# Patient Record
Sex: Male | Born: 1975 | Race: White | Hispanic: No | Marital: Single | Smoking: Former smoker
Health system: Southern US, Community
[De-identification: ages and names within clinical notes are randomized; demographics above are authoritative.]

## PROBLEM LIST (undated history)

## (undated) DIAGNOSIS — Z9221 Personal history of antineoplastic chemotherapy: Secondary | ICD-10-CM

## (undated) DIAGNOSIS — Z974 Presence of external hearing-aid: Secondary | ICD-10-CM

## (undated) DIAGNOSIS — G4733 Obstructive sleep apnea (adult) (pediatric): Secondary | ICD-10-CM

## (undated) DIAGNOSIS — I10 Essential (primary) hypertension: Secondary | ICD-10-CM

## (undated) DIAGNOSIS — K219 Gastro-esophageal reflux disease without esophagitis: Secondary | ICD-10-CM

## (undated) DIAGNOSIS — K429 Umbilical hernia without obstruction or gangrene: Secondary | ICD-10-CM

## (undated) HISTORY — PX: INGUINAL HERNIA REPAIR: SUR1180

## (undated) HISTORY — PX: KNEE ARTHROSCOPY W/ ACL RECONSTRUCTION: SHX1858

## (undated) HISTORY — PX: TYMPANOSTOMY TUBE PLACEMENT: SHX32

---

## 1998-12-03 ENCOUNTER — Emergency Department (HOSPITAL_COMMUNITY): Admission: EM | Admit: 1998-12-03 | Discharge: 1998-12-03 | Payer: Self-pay | Admitting: *Deleted

## 1998-12-03 ENCOUNTER — Encounter: Payer: Self-pay | Admitting: Emergency Medicine

## 1998-12-08 ENCOUNTER — Emergency Department (HOSPITAL_COMMUNITY): Admission: EM | Admit: 1998-12-08 | Discharge: 1998-12-08 | Payer: Self-pay | Admitting: Emergency Medicine

## 1999-09-04 ENCOUNTER — Emergency Department (HOSPITAL_COMMUNITY): Admission: EM | Admit: 1999-09-04 | Discharge: 1999-09-04 | Payer: Self-pay | Admitting: Emergency Medicine

## 1999-09-04 ENCOUNTER — Encounter: Payer: Self-pay | Admitting: Emergency Medicine

## 2003-12-03 ENCOUNTER — Encounter: Admission: RE | Admit: 2003-12-03 | Discharge: 2003-12-03 | Payer: Self-pay | Admitting: Family Medicine

## 2004-06-28 ENCOUNTER — Ambulatory Visit (HOSPITAL_COMMUNITY): Admission: RE | Admit: 2004-06-28 | Discharge: 2004-06-28 | Payer: Self-pay | Admitting: Family Medicine

## 2005-01-01 ENCOUNTER — Encounter: Admission: RE | Admit: 2005-01-01 | Discharge: 2005-01-01 | Payer: Self-pay | Admitting: Family Medicine

## 2008-05-08 DIAGNOSIS — D333 Benign neoplasm of cranial nerves: Secondary | ICD-10-CM

## 2008-05-08 HISTORY — DX: Benign neoplasm of cranial nerves: D33.3

## 2016-05-08 DIAGNOSIS — Q8502 Neurofibromatosis, type 2: Secondary | ICD-10-CM

## 2016-05-08 HISTORY — DX: Neurofibromatosis, type 2: Q85.02

## 2016-11-29 ENCOUNTER — Other Ambulatory Visit: Payer: Self-pay | Admitting: Family Medicine

## 2016-11-29 DIAGNOSIS — D333 Benign neoplasm of cranial nerves: Secondary | ICD-10-CM

## 2016-12-04 ENCOUNTER — Other Ambulatory Visit: Payer: Self-pay | Admitting: Family Medicine

## 2016-12-04 DIAGNOSIS — H93A2 Pulsatile tinnitus, left ear: Secondary | ICD-10-CM

## 2016-12-04 DIAGNOSIS — D333 Benign neoplasm of cranial nerves: Secondary | ICD-10-CM

## 2016-12-13 ENCOUNTER — Other Ambulatory Visit: Payer: Self-pay | Admitting: Family Medicine

## 2016-12-13 DIAGNOSIS — T1590XA Foreign body on external eye, part unspecified, unspecified eye, initial encounter: Secondary | ICD-10-CM

## 2016-12-27 ENCOUNTER — Ambulatory Visit
Admission: RE | Admit: 2016-12-27 | Discharge: 2016-12-27 | Disposition: A | Discharge status: Home / Self Care | Payer: Self-pay | Source: Ambulatory Visit | Attending: Family Medicine | Admitting: Family Medicine

## 2016-12-27 ENCOUNTER — Inpatient Hospital Stay: Admission: RE | Admit: 2016-12-27 | Payer: Self-pay | Source: Ambulatory Visit

## 2016-12-27 ENCOUNTER — Other Ambulatory Visit: Payer: Self-pay

## 2016-12-27 ENCOUNTER — Ambulatory Visit
Admission: RE | Admit: 2016-12-27 | Discharge: 2016-12-27 | Disposition: A | Discharge status: Home / Self Care | Payer: 59 | Source: Ambulatory Visit | Attending: Family Medicine | Admitting: Family Medicine

## 2016-12-27 DIAGNOSIS — D333 Benign neoplasm of cranial nerves: Secondary | ICD-10-CM

## 2016-12-27 DIAGNOSIS — H93A2 Pulsatile tinnitus, left ear: Secondary | ICD-10-CM

## 2016-12-27 MED ORDER — GADOBENATE DIMEGLUMINE 529 MG/ML IV SOLN
20.0000 mL | Freq: Once | INTRAVENOUS | Status: DC | PRN
Start: 2016-12-27 — End: 2016-12-28

## 2017-09-13 ENCOUNTER — Other Ambulatory Visit: Payer: Self-pay | Admitting: Family Medicine

## 2017-09-13 ENCOUNTER — Ambulatory Visit
Admission: RE | Admit: 2017-09-13 | Discharge: 2017-09-13 | Disposition: A | Discharge status: Home / Self Care | Payer: 59 | Source: Ambulatory Visit | Attending: Family Medicine | Admitting: Family Medicine

## 2017-09-13 DIAGNOSIS — M25561 Pain in right knee: Secondary | ICD-10-CM

## 2019-04-02 ENCOUNTER — Other Ambulatory Visit: Payer: Self-pay

## 2019-04-02 DIAGNOSIS — Z8616 Personal history of COVID-19: Secondary | ICD-10-CM

## 2019-04-02 DIAGNOSIS — Z20822 Contact with and (suspected) exposure to covid-19: Secondary | ICD-10-CM

## 2019-04-02 HISTORY — DX: Personal history of COVID-19: Z86.16

## 2019-04-04 LAB — NOVEL CORONAVIRUS, NAA: SARS-CoV-2, NAA: DETECTED — AB

## 2019-09-07 IMAGING — DX DG KNEE COMPLETE 4+V*R*
5 series · 5 of 5 positions shown · non-contrast
Comparison: None.

CLINICAL DATA: Right knee pain for 2-3 months.

EXAM:
RIGHT KNEE - COMPLETE 4+ VIEW

[dg knee complete 4 views right (1 of 5)]
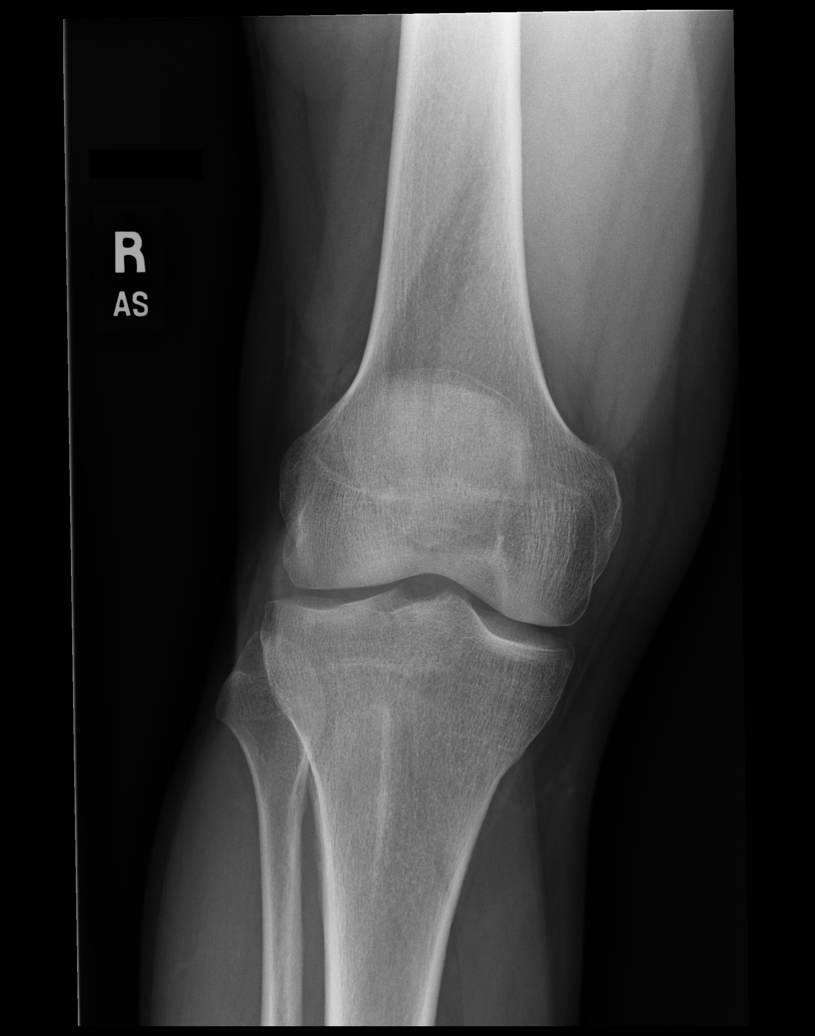

[dg knee complete 4 views right (2 of 5)]
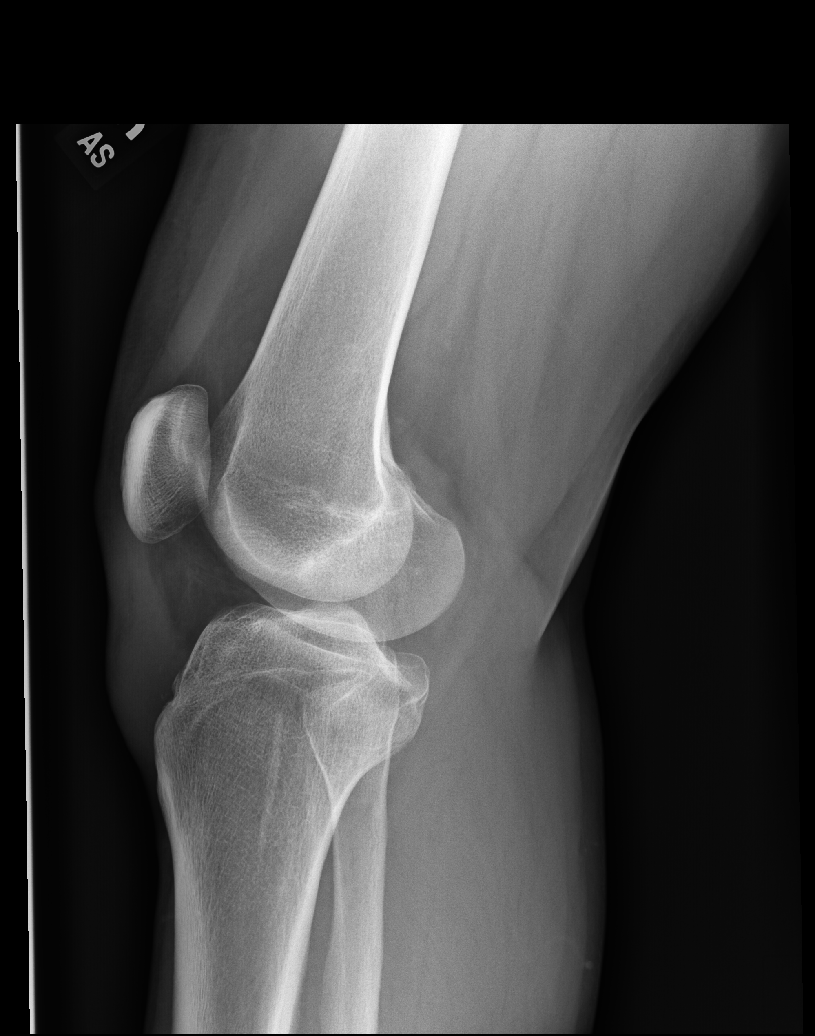

[dg knee complete 4 views right (3 of 5)]
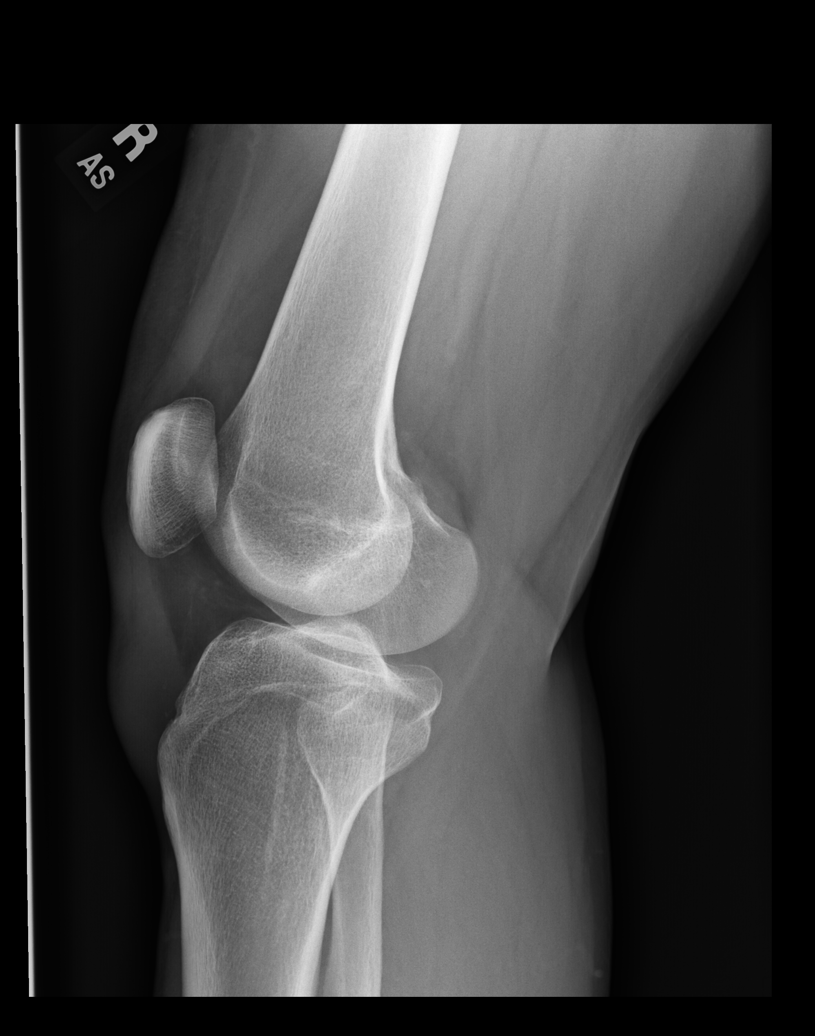

[dg knee complete 4 views right (4 of 5)]
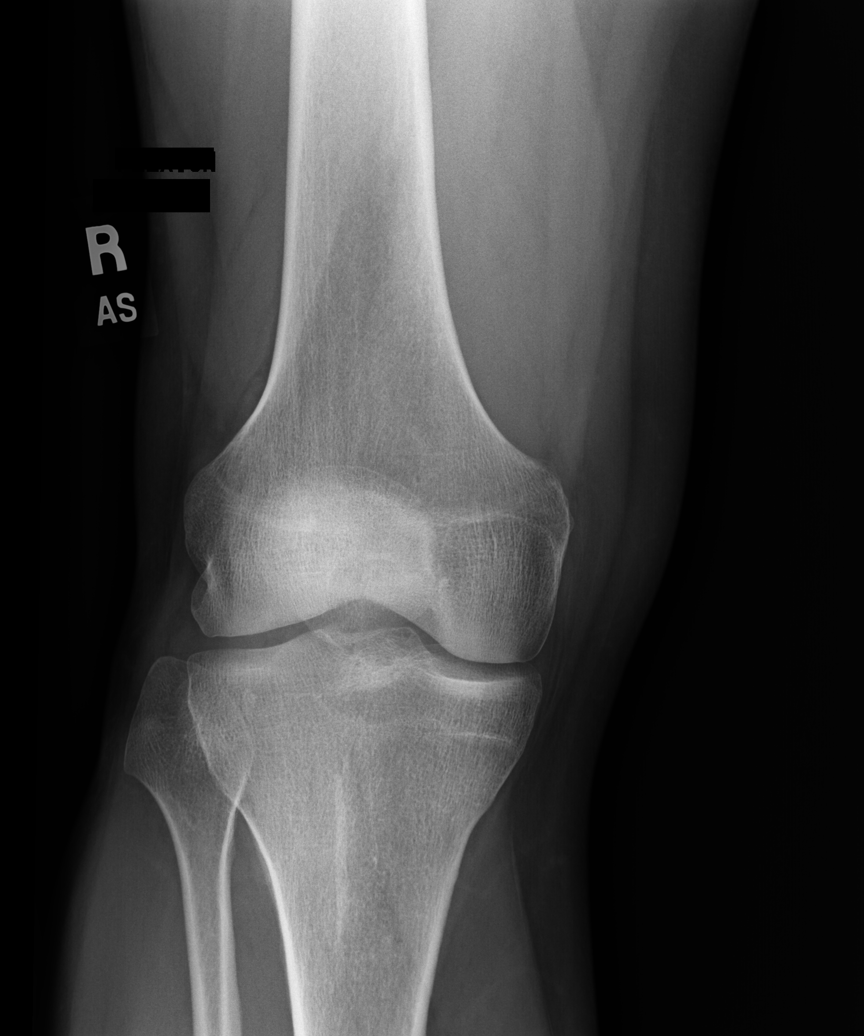

[dg knee complete 4 views right (5 of 5)]
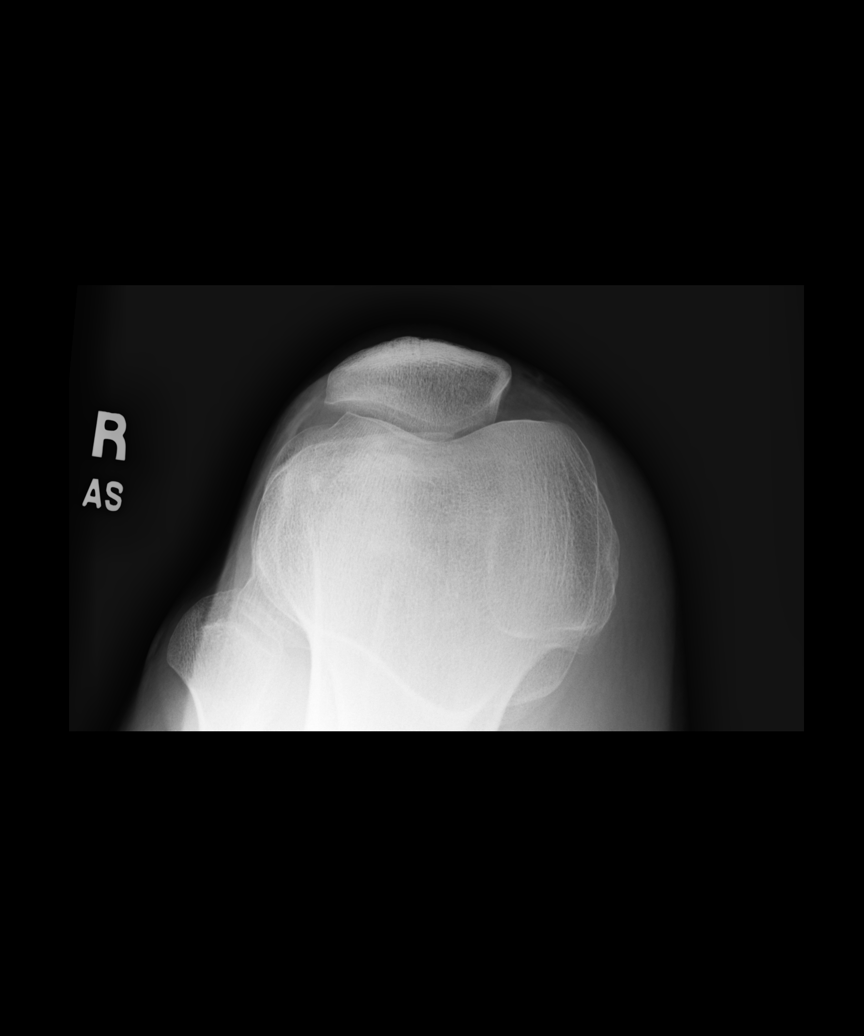

[5 of 5 positions shown; findings below may reference images not displayed]

FINDINGS: No evidence of fracture, dislocation, or joint effusion. No evidence
of arthropathy or other focal bone abnormality. Soft tissues are
unremarkable.
IMPRESSION: Negative.

## 2019-11-06 DIAGNOSIS — Z923 Personal history of irradiation: Secondary | ICD-10-CM

## 2019-11-06 HISTORY — DX: Personal history of irradiation: Z92.3

## 2020-02-11 DIAGNOSIS — Z01812 Encounter for preprocedural laboratory examination: Secondary | ICD-10-CM | POA: Diagnosis not present

## 2020-05-18 DIAGNOSIS — K219 Gastro-esophageal reflux disease without esophagitis: Secondary | ICD-10-CM | POA: Diagnosis not present

## 2020-05-18 DIAGNOSIS — K31A0 Gastric intestinal metaplasia, unspecified: Secondary | ICD-10-CM | POA: Diagnosis not present

## 2020-05-27 DIAGNOSIS — Q8502 Neurofibromatosis, type 2: Secondary | ICD-10-CM | POA: Diagnosis not present

## 2020-05-28 DIAGNOSIS — Q8502 Neurofibromatosis, type 2: Secondary | ICD-10-CM | POA: Diagnosis not present

## 2020-05-28 DIAGNOSIS — H9193 Unspecified hearing loss, bilateral: Secondary | ICD-10-CM | POA: Diagnosis not present

## 2020-06-01 ENCOUNTER — Ambulatory Visit: Payer: Self-pay | Admitting: General Surgery

## 2020-06-21 ENCOUNTER — Other Ambulatory Visit: Payer: Self-pay

## 2020-06-21 ENCOUNTER — Other Ambulatory Visit (HOSPITAL_COMMUNITY)
Admission: RE | Admit: 2020-06-21 | Discharge: 2020-06-21 | Disposition: A | Discharge status: Home / Self Care | Payer: BC Managed Care – PPO | Source: Ambulatory Visit | Attending: General Surgery | Admitting: General Surgery

## 2020-06-21 ENCOUNTER — Encounter (HOSPITAL_BASED_OUTPATIENT_CLINIC_OR_DEPARTMENT_OTHER): Payer: Self-pay | Admitting: General Surgery

## 2020-06-21 DIAGNOSIS — Z01812 Encounter for preprocedural laboratory examination: Secondary | ICD-10-CM | POA: Insufficient documentation

## 2020-06-21 DIAGNOSIS — Z8616 Personal history of COVID-19: Secondary | ICD-10-CM | POA: Diagnosis not present

## 2020-06-21 DIAGNOSIS — K429 Umbilical hernia without obstruction or gangrene: Secondary | ICD-10-CM | POA: Diagnosis not present

## 2020-06-21 DIAGNOSIS — Z9221 Personal history of antineoplastic chemotherapy: Secondary | ICD-10-CM | POA: Diagnosis not present

## 2020-06-21 DIAGNOSIS — Z20822 Contact with and (suspected) exposure to covid-19: Secondary | ICD-10-CM | POA: Insufficient documentation

## 2020-06-21 DIAGNOSIS — Q8502 Neurofibromatosis, type 2: Secondary | ICD-10-CM | POA: Diagnosis not present

## 2020-06-21 DIAGNOSIS — Z86018 Personal history of other benign neoplasm: Secondary | ICD-10-CM | POA: Diagnosis not present

## 2020-06-21 DIAGNOSIS — Z87891 Personal history of nicotine dependence: Secondary | ICD-10-CM | POA: Diagnosis not present

## 2020-06-21 DIAGNOSIS — Z79899 Other long term (current) drug therapy: Secondary | ICD-10-CM | POA: Diagnosis not present

## 2020-06-21 LAB — SARS CORONAVIRUS 2 (TAT 6-24 HRS): SARS Coronavirus 2: NEGATIVE

## 2020-06-21 NOTE — Progress Notes (Signed)
Spoke w/ via phone for pre-op interview--- PT Lab needs dos----  Istat and EKG             Lab results------ no COVID test ------ 06-21-2020 @ 1510 Arrive at ------- 0830 on 06-24-2020 NPO after MN NO Solid Food.  Clear liquids from MN until--- 0730 Medications to take morning of surgery ----- Norvasc, Prilosec Diabetic medication ----- n/a Patient Special Instructions ----- n/a Pre-Op special Istructions ----- n/a Patient verbalized understanding of instructions that were given at this phone interview. Patient denies shortness of breath, chest pain, fever, cough at this phone interview.  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

## 2020-06-24 ENCOUNTER — Encounter (HOSPITAL_BASED_OUTPATIENT_CLINIC_OR_DEPARTMENT_OTHER): Payer: Self-pay | Admitting: General Surgery

## 2020-06-24 ENCOUNTER — Ambulatory Visit (HOSPITAL_BASED_OUTPATIENT_CLINIC_OR_DEPARTMENT_OTHER): Payer: BC Managed Care – PPO | Admitting: Anesthesiology

## 2020-06-24 ENCOUNTER — Encounter (HOSPITAL_BASED_OUTPATIENT_CLINIC_OR_DEPARTMENT_OTHER)
Admission: RE | Disposition: A | Discharge status: Home / Self Care | Payer: Self-pay | Source: Home / Self Care | Attending: General Surgery

## 2020-06-24 ENCOUNTER — Ambulatory Visit (HOSPITAL_BASED_OUTPATIENT_CLINIC_OR_DEPARTMENT_OTHER)
Admission: RE | Admit: 2020-06-24 | Discharge: 2020-06-24 | Disposition: A | Discharge status: Home / Self Care | Payer: BC Managed Care – PPO | Attending: General Surgery | Admitting: General Surgery

## 2020-06-24 ENCOUNTER — Other Ambulatory Visit: Payer: Self-pay

## 2020-06-24 DIAGNOSIS — Z86018 Personal history of other benign neoplasm: Secondary | ICD-10-CM | POA: Insufficient documentation

## 2020-06-24 DIAGNOSIS — Z87891 Personal history of nicotine dependence: Secondary | ICD-10-CM | POA: Insufficient documentation

## 2020-06-24 DIAGNOSIS — Q8502 Neurofibromatosis, type 2: Secondary | ICD-10-CM | POA: Diagnosis not present

## 2020-06-24 DIAGNOSIS — K219 Gastro-esophageal reflux disease without esophagitis: Secondary | ICD-10-CM | POA: Diagnosis not present

## 2020-06-24 DIAGNOSIS — Z9221 Personal history of antineoplastic chemotherapy: Secondary | ICD-10-CM | POA: Insufficient documentation

## 2020-06-24 DIAGNOSIS — Z20822 Contact with and (suspected) exposure to covid-19: Secondary | ICD-10-CM | POA: Diagnosis not present

## 2020-06-24 DIAGNOSIS — G4730 Sleep apnea, unspecified: Secondary | ICD-10-CM | POA: Diagnosis not present

## 2020-06-24 DIAGNOSIS — Z79899 Other long term (current) drug therapy: Secondary | ICD-10-CM | POA: Insufficient documentation

## 2020-06-24 DIAGNOSIS — Z8616 Personal history of COVID-19: Secondary | ICD-10-CM | POA: Insufficient documentation

## 2020-06-24 DIAGNOSIS — K429 Umbilical hernia without obstruction or gangrene: Secondary | ICD-10-CM | POA: Diagnosis not present

## 2020-06-24 DIAGNOSIS — I10 Essential (primary) hypertension: Secondary | ICD-10-CM | POA: Diagnosis not present

## 2020-06-24 HISTORY — PX: UMBILICAL HERNIA REPAIR: SHX196

## 2020-06-24 HISTORY — DX: Gastro-esophageal reflux disease without esophagitis: K21.9

## 2020-06-24 HISTORY — DX: Essential (primary) hypertension: I10

## 2020-06-24 HISTORY — DX: Obstructive sleep apnea (adult) (pediatric): G47.33

## 2020-06-24 HISTORY — DX: Umbilical hernia without obstruction or gangrene: K42.9

## 2020-06-24 HISTORY — DX: Personal history of antineoplastic chemotherapy: Z92.21

## 2020-06-24 HISTORY — DX: Presence of external hearing-aid: Z97.4

## 2020-06-24 LAB — POCT I-STAT, CHEM 8
BUN: 13 mg/dL (ref 6–20)
Calcium, Ion: 1.3 mmol/L (ref 1.15–1.40)
Chloride: 105 mmol/L (ref 98–111)
Creatinine, Ser: 1 mg/dL (ref 0.61–1.24)
Glucose, Bld: 137 mg/dL — ABNORMAL HIGH (ref 70–99)
HCT: 45 % (ref 39.0–52.0)
Hemoglobin: 15.3 g/dL (ref 13.0–17.0)
Potassium: 3.7 mmol/L (ref 3.5–5.1)
Sodium: 143 mmol/L (ref 135–145)
TCO2: 25 mmol/L (ref 22–32)

## 2020-06-24 LAB — RESP PANEL BY RT-PCR (FLU A&B, COVID) ARPGX2
Influenza A by PCR: NEGATIVE
Influenza B by PCR: NEGATIVE
SARS Coronavirus 2 by RT PCR: NEGATIVE

## 2020-06-24 SURGERY — REPAIR, HERNIA, UMBILICAL, ADULT
Anesthesia: General

## 2020-06-24 MED ORDER — MIDAZOLAM HCL 5 MG/5ML IJ SOLN
INTRAMUSCULAR | Status: DC | PRN
  Administered 2020-06-24: 2 mg via INTRAVENOUS

## 2020-06-24 MED ORDER — PROPOFOL 10 MG/ML IV BOLUS
INTRAVENOUS | Status: AC
  Filled 2020-06-24: qty 40

## 2020-06-24 MED ORDER — KETOROLAC TROMETHAMINE 30 MG/ML IJ SOLN
INTRAMUSCULAR | Status: AC
  Filled 2020-06-24: qty 1

## 2020-06-24 MED ORDER — ROCURONIUM BROMIDE 100 MG/10ML IV SOLN
INTRAVENOUS | Status: DC | PRN
  Administered 2020-06-24 (×2): 10 mg via INTRAVENOUS
  Administered 2020-06-24: 60 mg via INTRAVENOUS
  Administered 2020-06-24: 10 mg via INTRAVENOUS

## 2020-06-24 MED ORDER — DEXMEDETOMIDINE (PRECEDEX) IN NS 20 MCG/5ML (4 MCG/ML) IV SYRINGE
PREFILLED_SYRINGE | INTRAVENOUS | Status: DC | PRN
  Administered 2020-06-24 (×2): 4 ug via INTRAVENOUS

## 2020-06-24 MED ORDER — HYDROMORPHONE HCL 1 MG/ML IJ SOLN
0.2500 mg | INTRAMUSCULAR | Status: DC | PRN
  Administered 2020-06-24 (×2): 0.25 mg via INTRAVENOUS

## 2020-06-24 MED ORDER — SUGAMMADEX SODIUM 200 MG/2ML IV SOLN
INTRAVENOUS | Status: DC | PRN
  Administered 2020-06-24: 250 mg via INTRAVENOUS

## 2020-06-24 MED ORDER — MIDAZOLAM HCL 2 MG/2ML IJ SOLN
INTRAMUSCULAR | Status: AC
  Filled 2020-06-24: qty 2

## 2020-06-24 MED ORDER — GABAPENTIN 300 MG PO CAPS
300.0000 mg | ORAL_CAPSULE | ORAL | Status: AC
  Administered 2020-06-24: 300 mg via ORAL

## 2020-06-24 MED ORDER — DEXMEDETOMIDINE (PRECEDEX) IN NS 20 MCG/5ML (4 MCG/ML) IV SYRINGE
PREFILLED_SYRINGE | INTRAVENOUS | Status: AC
  Filled 2020-06-24: qty 5

## 2020-06-24 MED ORDER — OXYCODONE HCL 5 MG/5ML PO SOLN
5.0000 mg | Freq: Once | ORAL | Status: DC | PRN
Start: 2020-06-24 — End: 2020-06-24

## 2020-06-24 MED ORDER — ACETAMINOPHEN 500 MG PO TABS
1000.0000 mg | ORAL_TABLET | ORAL | Status: AC
  Administered 2020-06-24: 1000 mg via ORAL

## 2020-06-24 MED ORDER — CHLORHEXIDINE GLUCONATE CLOTH 2 % EX PADS: 6.0000 | MEDICATED_PAD | Freq: Once | CUTANEOUS | Status: DC

## 2020-06-24 MED ORDER — IBUPROFEN 800 MG PO TABS: 800.0000 mg | ORAL_TABLET | Freq: Three times a day (TID) | ORAL | 0 refills | Status: AC | PRN

## 2020-06-24 MED ORDER — ROCURONIUM BROMIDE 10 MG/ML (PF) SYRINGE
PREFILLED_SYRINGE | INTRAVENOUS | Status: AC
  Filled 2020-06-24: qty 10

## 2020-06-24 MED ORDER — CELECOXIB 200 MG PO CAPS
ORAL_CAPSULE | ORAL | Status: AC
  Filled 2020-06-24: qty 2

## 2020-06-24 MED ORDER — HYDROMORPHONE HCL 1 MG/ML IJ SOLN
INTRAMUSCULAR | Status: AC
  Filled 2020-06-24: qty 1

## 2020-06-24 MED ORDER — BUPIVACAINE LIPOSOME 1.3 % IJ SUSP
INTRAMUSCULAR | Status: DC | PRN
  Administered 2020-06-24: 20 mL

## 2020-06-24 MED ORDER — CELECOXIB 200 MG PO CAPS
400.0000 mg | ORAL_CAPSULE | ORAL | Status: AC
  Administered 2020-06-24: 400 mg via ORAL

## 2020-06-24 MED ORDER — ENSURE PRE-SURGERY PO LIQD: 296.0000 mL | Freq: Once | ORAL | Status: DC

## 2020-06-24 MED ORDER — OXYCODONE HCL 5 MG PO TABS: 5.0000 mg | ORAL_TABLET | Freq: Four times a day (QID) | ORAL | 0 refills | Status: DC | PRN

## 2020-06-24 MED ORDER — FENTANYL CITRATE (PF) 100 MCG/2ML IJ SOLN
INTRAMUSCULAR | Status: DC | PRN
  Administered 2020-06-24 (×2): 50 ug via INTRAVENOUS
  Administered 2020-06-24: 100 ug via INTRAVENOUS

## 2020-06-24 MED ORDER — MEPERIDINE HCL 25 MG/ML IJ SOLN: 6.2500 mg | INTRAMUSCULAR | Status: DC | PRN

## 2020-06-24 MED ORDER — DEXAMETHASONE SODIUM PHOSPHATE 10 MG/ML IJ SOLN
INTRAMUSCULAR | Status: DC | PRN
  Administered 2020-06-24: 10 mg via INTRAVENOUS

## 2020-06-24 MED ORDER — PROMETHAZINE HCL 25 MG/ML IJ SOLN: 6.2500 mg | INTRAMUSCULAR | Status: DC | PRN

## 2020-06-24 MED ORDER — CEFAZOLIN SODIUM-DEXTROSE 2-4 GM/100ML-% IV SOLN
INTRAVENOUS | Status: AC
  Filled 2020-06-24: qty 100

## 2020-06-24 MED ORDER — LACTATED RINGERS IV SOLN: INTRAVENOUS | Status: DC

## 2020-06-24 MED ORDER — GABAPENTIN 300 MG PO CAPS
ORAL_CAPSULE | ORAL | Status: AC
  Filled 2020-06-24: qty 1

## 2020-06-24 MED ORDER — ONDANSETRON HCL 4 MG/2ML IJ SOLN
INTRAMUSCULAR | Status: DC | PRN
  Administered 2020-06-24 (×2): 4 mg via INTRAVENOUS

## 2020-06-24 MED ORDER — FENTANYL CITRATE (PF) 250 MCG/5ML IJ SOLN
INTRAMUSCULAR | Status: AC
  Filled 2020-06-24: qty 5

## 2020-06-24 MED ORDER — ACETAMINOPHEN 500 MG PO TABS
ORAL_TABLET | ORAL | Status: AC
  Filled 2020-06-24: qty 2

## 2020-06-24 MED ORDER — OXYCODONE HCL 5 MG PO TABS: 5.0000 mg | ORAL_TABLET | Freq: Once | ORAL | Status: DC | PRN

## 2020-06-24 MED ORDER — KETOROLAC TROMETHAMINE 30 MG/ML IJ SOLN
30.0000 mg | Freq: Once | INTRAMUSCULAR | Status: AC | PRN
  Administered 2020-06-24: 30 mg via INTRAVENOUS

## 2020-06-24 MED ORDER — LIDOCAINE 2% (20 MG/ML) 5 ML SYRINGE
INTRAMUSCULAR | Status: DC | PRN
  Administered 2020-06-24: 60 mg via INTRAVENOUS

## 2020-06-24 MED ORDER — LIDOCAINE HCL (PF) 2 % IJ SOLN
INTRAMUSCULAR | Status: AC
  Filled 2020-06-24: qty 5

## 2020-06-24 MED ORDER — CEFAZOLIN SODIUM-DEXTROSE 2-4 GM/100ML-% IV SOLN
2.0000 g | INTRAVENOUS | Status: AC
  Administered 2020-06-24: 2 g via INTRAVENOUS

## 2020-06-24 MED ORDER — BUPIVACAINE HCL 0.25 % IJ SOLN
INTRAMUSCULAR | Status: DC | PRN
Start: 2020-06-24 — End: 2020-06-24
  Administered 2020-06-24: 30 mL

## 2020-06-24 MED ORDER — PROPOFOL 10 MG/ML IV BOLUS
INTRAVENOUS | Status: DC | PRN
  Administered 2020-06-24: 200 mg via INTRAVENOUS

## 2020-06-24 SURGICAL SUPPLY — 49 items
ADH SKN CLS APL DERMABOND .7 (GAUZE/BANDAGES/DRESSINGS) ×1
APL PRP STRL LF DISP 70% ISPRP (MISCELLANEOUS) ×1
BINDER ABDOMINAL 12 ML 46-62 (SOFTGOODS) ×2 IMPLANT
BLADE CLIPPER SENSICLIP SURGIC (BLADE) IMPLANT
BLADE HEX COATED 2.75 (ELECTRODE) IMPLANT
BLADE SURG 15 STRL LF DISP TIS (BLADE) ×1 IMPLANT
BLADE SURG 15 STRL SS (BLADE) ×2
CANISTER SUCT 3000ML PPV (MISCELLANEOUS) IMPLANT
CELLS DAT CNTRL 66122 CELL SVR (MISCELLANEOUS) IMPLANT
CHLORAPREP W/TINT 26 (MISCELLANEOUS) ×2 IMPLANT
COVER BACK TABLE 60X90IN (DRAPES) ×2 IMPLANT
COVER MAYO STAND STRL (DRAPES) ×2 IMPLANT
COVER WAND RF STERILE (DRAPES) ×2 IMPLANT
DECANTER SPIKE VIAL GLASS SM (MISCELLANEOUS) IMPLANT
DERMABOND ADVANCED (GAUZE/BANDAGES/DRESSINGS) ×1
DERMABOND ADVANCED .7 DNX12 (GAUZE/BANDAGES/DRESSINGS) ×1 IMPLANT
DRAPE LAPAROSCOPIC ABDOMINAL (DRAPES) ×2 IMPLANT
DRAPE UTILITY XL STRL (DRAPES) ×2 IMPLANT
ELECT REM PT RETURN 9FT ADLT (ELECTROSURGICAL) ×2
ELECTRODE REM PT RTRN 9FT ADLT (ELECTROSURGICAL) ×1 IMPLANT
GLOVE SURG POLYISO LF SZ7 (GLOVE) ×2 IMPLANT
GLOVE SURG UNDER POLY LF SZ7 (GLOVE) ×2 IMPLANT
GOWN STRL REUS W/ TWL LRG LVL3 (GOWN DISPOSABLE) ×1 IMPLANT
GOWN STRL REUS W/TWL LRG LVL3 (GOWN DISPOSABLE) ×6 IMPLANT
KIT TURNOVER CYSTO (KITS) ×2 IMPLANT
MESH ULTRAPRO 6X6 15CM15CM (Mesh General) ×2 IMPLANT
NEEDLE HYPO 25X1 1.5 SAFETY (NEEDLE) ×2 IMPLANT
NS IRRIG 500ML POUR BTL (IV SOLUTION) ×2 IMPLANT
PACK BASIN DAY SURGERY FS (CUSTOM PROCEDURE TRAY) ×2 IMPLANT
PENCIL SMOKE EVACUATOR (MISCELLANEOUS) ×2 IMPLANT
RTRCTR WOUND ALEXIS 18CM MED (MISCELLANEOUS)
RTRCTR WOUND ALEXIS 18CM SML (INSTRUMENTS)
SAVER CELL AAL HAEMONETICS (INSTRUMENTS) IMPLANT
SPONGE LAP 4X18 RFD (DISPOSABLE) ×2 IMPLANT
SUT MNCRL AB 4-0 PS2 18 (SUTURE) ×2 IMPLANT
SUT NOVA NAB GS-21 0 18 T12 DT (SUTURE) ×2 IMPLANT
SUT PDS AB 0 CT1 36 (SUTURE) IMPLANT
SUT PDS AB 2-0 CT2 27 (SUTURE) ×4 IMPLANT
SUT PROLENE 0 CT 1 CR/8 (SUTURE) ×2 IMPLANT
SUT VIC AB 0 SH 27 (SUTURE) IMPLANT
SUT VIC AB 2-0 SH 27 (SUTURE)
SUT VIC AB 2-0 SH 27XBRD (SUTURE) IMPLANT
SUT VIC AB 3-0 SH 27 (SUTURE) ×4
SUT VIC AB 3-0 SH 27X BRD (SUTURE) ×2 IMPLANT
SYR BULB IRRIG 60ML STRL (SYRINGE) ×2 IMPLANT
SYR CONTROL 10ML LL (SYRINGE) ×2 IMPLANT
TOWEL OR 17X26 10 PK STRL BLUE (TOWEL DISPOSABLE) ×4 IMPLANT
TUBE CONNECTING 12X1/4 (SUCTIONS) ×2 IMPLANT
YANKAUER SUCT BULB TIP NO VENT (SUCTIONS) ×2 IMPLANT

## 2020-06-24 NOTE — Anesthesia Preprocedure Evaluation (Addendum)
Anesthesia Evaluation  Patient identified by MRN, date of birth, ID band Patient awake    Reviewed: Allergy & Precautions, NPO status , Patient's Chart, lab work & pertinent test results  Airway Mallampati: II  TM Distance: >3 FB Neck ROM: Full    Dental no notable dental hx. (+) Teeth Intact, Dental Advisory Given   Pulmonary sleep apnea , former smoker,  Quit smoking 2013   Pulmonary exam normal breath sounds clear to auscultation       Cardiovascular hypertension, Pt. 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, headaches S/p chemo 2018, 2019 S/p surgery for vestibular schwannoma  negative psych ROS   GI/Hepatic GERD  Medicated and Controlled,(+)     substance abuse  alcohol use, Umbilical hernia   Endo/Other  Obesity BMI 35  Renal/GU negative Renal ROS  negative genitourinary   Musculoskeletal negative musculoskeletal ROS (+)   Abdominal   Peds  Hematology negative hematology ROS (+)   Anesthesia Other Findings B/L hearing aides  Reproductive/Obstetrics negative OB ROS                           Anesthesia Physical Anesthesia Plan  ASA: II  Anesthesia Plan: General   Post-op Pain Management:    Induction: Intravenous  PONV Risk Score and Plan: 2 and Ondansetron, Dexamethasone, Midazolam and Treatment may vary due to age or medical condition  Airway Management Planned: Oral ETT  Additional Equipment: None  Intra-op Plan:   Post-operative Plan: Extubation in OR  Informed Consent: I have reviewed the patients History and Physical, chart, labs and discussed the procedure including the risks, benefits and alternatives for the proposed anesthesia with the patient or authorized representative who has indicated his/her understanding and acceptance.     Dental  advisory given  Plan Discussed with: CRNA  Anesthesia Plan Comments:        Anesthesia Quick Evaluation

## 2020-06-24 NOTE — Anesthesia Procedure Notes (Signed)
Procedure Name: Intubation Date/Time: 06/24/2020 10:37 AM Performed by: Gwyndolyn Saxon, CRNA Pre-anesthesia Checklist: Patient identified, Emergency Drugs available, Suction available and Patient being monitored Patient Re-evaluated:Patient Re-evaluated prior to induction Oxygen Delivery Method: Circle system utilized Preoxygenation: Pre-oxygenation with 100% oxygen Induction Type: IV induction Ventilation: Mask ventilation without difficulty Laryngoscope Size: Miller and 3 Grade View: Grade III Tube type: Oral Tube size: 7.5 mm Number of attempts: 1 Airway Equipment and Method: Patient positioned with wedge pillow and Stylet Placement Confirmation: ETT inserted through vocal cords under direct vision,  positive ETCO2 and breath sounds checked- equal and bilateral Secured at: 22 cm Tube secured with: Tape Dental Injury: Teeth and Oropharynx as per pre-operative assessment

## 2020-06-24 NOTE — Discharge Instructions (Signed)
[facility] _______[facility]  UMBILICAL OR INGUINAL HERNIA REPAIR: POST OP INSTRUCTIONS  Always review your discharge instruction sheet given to you by the facility where your surgery was performed. IF YOU HAVE DISABILITY OR FAMILY LEAVE FORMS, YOU MUST BRING THEM TO THE OFFICE FOR PROCESSING.   DO NOT GIVE THEM TO YOUR DOCTOR.  1. A  prescription for pain medication may be given to you upon discharge.  Take your pain medication as prescribed, if needed.  If narcotic pain medicine is not needed, then you may take acetaminophen (Tylenol) or ibuprofen (Advil) as needed. 2. Take your usually prescribed medications unless otherwise directed. If you need a refill on your pain medication, please contact your pharmacy.  They will contact our office to request authorization. Prescriptions will not be filled after 5 pm or on week-ends. 3. You should follow a light diet the first 24 hours after arrival home, such as soup and crackers, etc.  Be sure to include lots of fluids daily.  Resume your normal diet the day after surgery. 4.Most patients will experience some swelling and bruising around the umbilicus or in the groin and scrotum.  Ice packs and reclining will help.  Swelling and bruising can take several days to resolve.  6. It is common to experience some constipation if taking pain medication after surgery.  Increasing fluid intake and taking a stool softener (such as Colace) will usually help or prevent this problem from occurring.  A mild laxative (Milk of Magnesia or Miralax) should be taken according to package directions if there are no bowel movements after 48 hours. 7. Unless discharge instructions indicate otherwise, you may remove your bandages 24-48 hours after surgery, and you may shower at that time. If your surgeon used skin glue on the incision, you may shower in 24 hours.  The glue will flake off over the next 2-3 weeks.  Any sutures or staples will be removed at the office  during your follow-up visit. 8. ACTIVITIES:  You may resume regular (light) daily activities beginning the next day--such as daily self-care, walking, climbing stairs--gradually increasing activities as tolerated.  You may have sexual intercourse when it is comfortable.  Refrain from any heavy lifting or straining until approved by your doctor.  a.You may drive when you are no longer taking prescription pain medication, you can comfortably wear a seatbelt, and you can safely maneuver your car and apply brakes. b.RETURN TO WORK:   _____________________________________________  9.You should see your doctor in the office for a follow-up appointment approximately 2-3 weeks after your surgery.  Make sure that you call for this appointment within a day or two after you arrive home to insure a convenient appointment time. 10.OTHER INSTRUCTIONS: _________________________    _____________________________________  WHEN TO CALL YOUR DOCTOR: 1. Fever over 101.0 2. Inability to urinate 3. Nausea and/or vomiting 4. Extreme swelling or bruising 5. Continued bleeding from incision. 6. Increased pain, redness, or drainage from the incision  The clinic staff is available to answer your questions during regular business hours.  Please don't hesitate to call and ask to speak to one of the nurses for clinical concerns.  If you have a medical emergency, go to the nearest emergency room or call 911.  A surgeon from [facility] is always on call at the hospital   [address] [place], [place], [place]  [address] ?  [address], [place], [place]   [address] [phone] ? [phone] ? FAX [phone] Web site: [email or web address]   Post Anesthesia Home Care Instructions  Activity: Get plenty of rest for the remainder of the day. A responsible individual must stay with you for 24 hours following the procedure.  For the next 24 hours, DO NOT: -Drive a car -Conservation officer, nature -Drink alcoholic beverages -Take any medication unless instructed by your physician -Make any legal decisions or sign important papers.  Meals: Start with liquid foods such as gelatin or soup. Progress to regular foods as tolerated. Avoid greasy, spicy, heavy foods. If nausea and/or vomiting occur, drink only clear liquids until the nausea and/or vomiting subsides. Call your physician if vomiting continues.  Special Instructions/Symptoms: Your throat may feel dry or sore from the anesthesia or the breathing tube placed in your throat during surgery. If this causes discomfort, gargle with warm salt water. The discomfort should disappear within 24 hours.  If you had a scopolamine patch placed behind your ear for the management of post- operative nausea and/or vomiting:  1. The medication in the patch is effective for 72 hours, after which it should be removed.  Wrap patch in a tissue and discard in the trash. Wash hands thoroughly with soap and water. 2. You may remove the patch earlier than 72 hours if you experience unpleasant side effects which may include dry mouth, dizziness or visual disturbances. 3. Avoid touching the patch. Wash your hands with soap and water after contact with the patch.     Information for Discharge Teaching: EXPAREL (bupivacaine liposome injectable suspension)   Your surgeon or anesthesiologist gave you EXPAREL(bupivacaine) to help control your pain after surgery.   EXPAREL is a local anesthetic that provides pain relief by numbing the tissue around the surgical site.  EXPAREL is designed to release pain medication over time and can control pain for up to 72 hours.  Depending on how you respond to EXPAREL, you may require less pain medication during your recovery.  Possible side effects:  Temporary loss of sensation or ability to move in the area where bupivacaine was injected.  Nausea, vomiting, constipation  Rarely, numbness and tingling in  your mouth or lips, lightheadedness, or anxiety may occur.  Call your doctor right away if you think you may be experiencing any of these sensations, or if you have other questions regarding possible side effects.  Follow all other discharge instructions given to you by your surgeon or nurse. Eat a healthy diet and drink plenty of water or other fluids.  If you return to the hospital for any reason within 96 hours following the administration of EXPAREL, it is important for health care providers to know that you have received this anesthetic. A teal colored band has been placed on your arm with the date, time and amount of EXPAREL you have received in order to alert and inform your health care providers. Please leave this armband in place for the full 96 hours following administration, and then you may remove the band. (Monday 06/28/20)

## 2020-06-24 NOTE — Op Note (Signed)
PATIENT:  Dan Jackson  45 y.o. male  PRE-OPERATIVE DIAGNOSIS:  UMBILICAL HERNIA  POST-OPERATIVE DIAGNOSIS:  UMBILICAL HERNIA  PROCEDURE:  Procedure(s): OPEN UMBILICAL HERNIA REPAIR WITH MESH Placement of mesh between the rectus muscle and posterior rectus sheath  SURGEON:  Surgeon(s): Jeanpierre Thebeau, Arta Bruce, MD  ASSISTANT: none   ANESTHESIA:   local and general  Indications for procedure: Dan Jackson is a 45 y.o. year old male with symptoms of abdominal pain and umbilical hernia.  Description of procedure: The patient was brought into the operative suite. Anesthesia was administered with General endotracheal anesthesia. WHO checklist was applied. The patient was then placed in supine. The area was prepped and draped in the usual sterile fashion.  Next the infraumbilical skin was anesthetized with Marcaine/Exparel mix. A semilunar infraumbilical incision was made. Cautery and blunt dissection was used to dissect down to the fascia. The hernia sac was dissected free from surrounding tissues in 360 degrees. The umbilical skin was dissected free of the hernia sac with cautery. The hernia sac contained omentum and was opened and contents reduced into the peritoneal space. The hernia defect was 4 cm in diameter.   The retrorectus space was entered and dissected in 360 degrees. One area of posterior sheath was sutured with 3-0 vicryl to cover the peritoneum. The hernia sac was closed with 3-0 vicryl in running fashion. A 15 x 15 cm ultrapro mesh was placed into the retrorectus muscle. The anterior rectus sheath was then closed with 2-0 PDS in running fashion incorporating the mesh in the superior and inferior sutures to hold the mesh in place.   The umbilical skin was sutured to the fascia with a 3-0 vicryl. The deep dermal space was closed with a 3-0 vicryl. Marcaine/Exparel mix was injected into the muscle layer and around the fascia. The skin was closed with a 4-0 monocryl subcuticular  suture. Dermabond was put in place for dressing. The patient awoke from anesthesia and was brought to pacu in stable condition. All counts were correct.  Findings: 4 cm umbilical hernia  Specimen: none  Blood loss: 20 ml  Local anesthesia: 50 ml Marcaine/Exparel mix   Mesh: 15 x 15 cm ultrapro in the retrorectus space  Complications: none  PLAN OF CARE: Discharge to home after PACU  PATIENT DISPOSITION:  PACU - hemodynamically stable.  Gurney Maxin, M.D. General, Bariatric, & Minimally Invasive Surgery [facility], [place]  06/24/2020 11:49 AM

## 2020-06-24 NOTE — Anesthesia Postprocedure Evaluation (Signed)
Anesthesia Post Note  Patient: Dan Jackson  Procedure(s) Performed: OPEN UMBILICAL HERNIA REPAIR WITH MESH (N/A )     Patient location during evaluation: PACU Anesthesia Type: General Level of consciousness: awake and alert, oriented and patient cooperative Pain management: pain level controlled Vital Signs Assessment: post-procedure vital signs reviewed and stable Respiratory status: spontaneous breathing, nonlabored ventilation and respiratory function stable Cardiovascular status: blood pressure returned to baseline and stable Postop Assessment: no apparent nausea or vomiting Anesthetic complications: no   No complications documented.  Last Vitals:  Vitals:   06/24/20 1245 06/24/20 1300  BP: (!) 139/93 (!) 132/91  Pulse: 75 77  Resp: 17 17  Temp:    SpO2: 94% 94%    Last Pain:  Vitals:   06/24/20 1300  TempSrc:   PainSc: [place]

## 2020-06-24 NOTE — Transfer of Care (Signed)
Immediate Anesthesia Transfer of Care Note  Patient: Dan Jackson  Procedure(s) Performed: OPEN UMBILICAL HERNIA REPAIR WITH MESH (N/A )  Patient Location: PACU  Anesthesia Type:General  Level of Consciousness: awake and patient cooperative  Airway & Oxygen Therapy: Patient Spontanous Breathing and Patient connected to nasal cannula oxygen  Post-op Assessment: Report given to RN and Post -op Vital signs reviewed and stable  Post vital signs: Reviewed and stable  Last Vitals:  Vitals Value Taken Time  BP 128/92 06/24/20 1200  Temp 37 C 06/24/20 1158  Pulse 80 06/24/20 1206  Resp 12 06/24/20 1206  SpO2 95 % 06/24/20 1206  Vitals shown include unvalidated device data.  Last Pain:  Vitals:   06/24/20 1158  TempSrc:   PainSc: 0-No pain      Patients Stated Pain Goal: 5 (57/47/34 0370)  Complications: No complications documented.

## 2020-06-24 NOTE — H&P (Signed)
Dan Jackson is an 45 y.o. male.   Chief Complaint: hernia HPI: 45 yo male with umbilical hernia that causing pain especially with activity. He denies nausea or vomiting.  Past Medical History:  Diagnosis Date  . GERD (gastroesophageal reflux disease)   . History of 2019 novel coronavirus disease (COVID-19) 04/02/2019   positive covid result in epic,  mild symptoms that resolved  . History of chemotherapy 01-26-2017  to  02-08-2018   neurofibromatos type 2  . Hypertension   . 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 strowd ([facility])  dx 2010 bilateral VS with no symptoms other than headaches, hearing issues started 2018, then dx NF2 and progressive right VS,  chemotherapy 01-26-2017 to 02-08-2018,  GK-SRS 07/ 2021 4 fractions  . OSA on CPAP   . Status post gamma knife treatment 11/2019   sterotactic radiosurgery in 4 fractions, right vestribular schwannoma  . Umbilical hernia   . 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 Wears hearing aid in both ears     Past Surgical History:  Procedure Laterality Date  . INGUINAL HERNIA REPAIR Left   . KNEE ARTHROSCOPY W/ ACL RECONSTRUCTION Left teen  . TYMPANOSTOMY TUBE PLACEMENT      History reviewed. No pertinent family history. Social History:  reports that he quit smoking about 9 years ago. His smoking use included cigarettes. He quit after 10.00 years of use. He quit smokeless tobacco use about 6 months ago.  His smokeless tobacco use included chew. He reports current alcohol use of about 12.0 standard drinks of alcohol per week. He reports that he does not use drugs.  Allergies: No Known Allergies  Medications Prior to Admission  Medication Sig Dispense Refill  . amLODipine (NORVASC) 5 MG tablet Take 5 mg by mouth daily.    . hydrochlorothiazide (HYDRODIURIL) 25 MG tablet Take 25 mg by mouth daily.    Marland Kitchen olmesartan (BENICAR) 40 MG tablet Take 40 mg by mouth daily.    Marland Kitchen omeprazole (PRILOSEC) 20  MG capsule Take 20 mg by mouth daily.      Results for orders placed or performed during the hospital encounter of 06/24/20 (from the past 48 hour(s))  I-STAT, chem 8     Status: Abnormal   Collection Time: 06/24/20  9:01 AM  Result Value Ref Range   Sodium 143 135 - 145 mmol/L   Potassium 3.7 3.5 - 5.1 mmol/L   Chloride 105 98 - 111 mmol/L   BUN 13 6 - 20 mg/dL   Creatinine, Ser 1.00 0.61 - 1.24 mg/dL   Glucose, Bld 137 (H) 70 - 99 mg/dL    Comment: Glucose reference range applies only to samples taken after fasting for at least 8 hours.   Calcium, Ion 1.30 1.15 - 1.40 mmol/L   TCO2 25 22 - 32 mmol/L   Hemoglobin 15.3 13.0 - 17.0 g/dL   HCT 45.0 39.0 - 52.0 %  Resp Panel by RT-PCR (Flu A&B, Covid) Nasopharyngeal Swab     Status: None   Collection Time: 06/24/20  9:11 AM   Specimen: Nasopharyngeal Swab; Nasopharyngeal(NP) swabs in vial transport medium  Result Value Ref Range   SARS Coronavirus 2 by RT PCR NEGATIVE NEGATIVE    Comment: (NOTE) SARS-CoV-2 target nucleic acids are NOT DETECTED.  The SARS-CoV-2 RNA is generally detectable in upper respiratory specimens during the acute phase of infection. The lowest concentration of SARS-CoV-2 viral copies this assay  can detect is 138 copies/mL. A negative result does not preclude SARS-Cov-2 infection and should not be used as the sole basis for treatment or other patient management decisions. A negative result may occur with  improper specimen collection/handling, submission of specimen other than nasopharyngeal swab, presence of viral mutation(s) within the areas targeted by this assay, and inadequate number of viral copies(<138 copies/mL). A negative result must be combined with clinical observations, patient history, and epidemiological information. The expected result is Negative.  Fact Sheet for Patients:  EntrepreneurPulse.com.au  Fact Sheet for Healthcare Providers:   IncredibleEmployment.be  This test is no t yet approved or cleared by the [place] FDA and  has been authorized for detection and/or diagnosis of SARS-CoV-2 by FDA under an Emergency Use Authorization (EUA). This EUA will remain  in effect (meaning this test can be used) for the duration of the COVID-19 declaration under Section 564(b)(1) of the Act, 21 U.S.C.section 360bbb-3(b)(1), unless the authorization is terminated  or revoked sooner.       Influenza A by PCR NEGATIVE NEGATIVE   Influenza B by PCR NEGATIVE NEGATIVE    Comment: (NOTE) The Xpert Xpress SARS-CoV-2/FLU/RSV plus assay is intended as an aid in the diagnosis of influenza from Nasopharyngeal swab specimens and should not be used as a sole basis for treatment. Nasal washings and aspirates are unacceptable for Xpert Xpress SARS-CoV-2/FLU/RSV testing.  Fact Sheet for Patients: EntrepreneurPulse.com.au  Fact Sheet for Healthcare Providers: IncredibleEmployment.be  This test is not yet approved or cleared by the [place] FDA and has been authorized for detection and/or diagnosis of SARS-CoV-2 by FDA under an Emergency Use Authorization (EUA). This EUA will remain in effect (meaning this test can be used) for the duration of the COVID-19 declaration under Section 564(b)(1) of the Act, 21 U.S.C. section 360bbb-3(b)(1), unless the authorization is terminated or revoked.  Performed at [facility], [place] [address], [place], [place] [address]    No results found.  Review of Systems  Constitutional: Negative for chills and fever.  HENT: Negative for hearing loss.   Respiratory: Negative for cough.   Cardiovascular: Negative for chest pain and palpitations.  Gastrointestinal: Negative for abdominal pain, nausea and vomiting.  Genitourinary: Negative for dysuria and urgency.  Musculoskeletal: Negative for myalgias and neck pain.   Skin: Negative for rash.  Neurological: Negative for dizziness and headaches.  Hematological: Does not bruise/bleed easily.  Psychiatric/Behavioral: Negative for suicidal ideas.    Blood pressure 139/87, pulse 73, temperature 98.1 F (36.7 C), temperature source Oral, resp. rate 18, height 6\' 2"  (1.88 m), weight 124 kg. Physical Exam Vitals and nursing note reviewed.  Constitutional:      Appearance: He is well-developed and well-nourished.  HENT:     Head: Normocephalic and atraumatic.  Eyes:     General: No scleral icterus.    Extraocular Movements: EOM normal.     Conjunctiva/sclera: Conjunctivae normal.  Cardiovascular:     Rate and Rhythm: Normal rate and regular rhythm.  Pulmonary:     Effort: Pulmonary effort is normal.     Breath sounds: Normal breath sounds. No wheezing or rales.  Chest:     Chest wall: No tenderness.  Abdominal:     General: There is no distension.     Palpations: Abdomen is soft.     Tenderness: There is no abdominal tenderness. There is no rebound.     Comments: Umbilical hernia  Musculoskeletal:        General: No  edema. Normal range of motion.     Cervical back: Normal range of motion and neck supple.  Skin:    General: Skin is warm and dry.  Neurological:     Mental Status: He is alert and oriented to person, place, and time.  Psychiatric:        Mood and Affect: Mood and affect normal.        Behavior: Behavior normal.      Assessment/Plan 45 yo male with umbilical hernia -open umbilical hernia with mesh -planned outpatient procedure  Mickeal Skinner, MD 06/24/2020, 10:05 AM

## 2020-06-25 ENCOUNTER — Encounter (HOSPITAL_BASED_OUTPATIENT_CLINIC_OR_DEPARTMENT_OTHER): Payer: Self-pay | Admitting: General Surgery

## 2020-06-28 ENCOUNTER — Encounter (HOSPITAL_BASED_OUTPATIENT_CLINIC_OR_DEPARTMENT_OTHER): Payer: Self-pay | Admitting: General Surgery

## 2020-07-27 DIAGNOSIS — I10 Essential (primary) hypertension: Secondary | ICD-10-CM | POA: Diagnosis not present

## 2020-07-27 DIAGNOSIS — Z125 Encounter for screening for malignant neoplasm of prostate: Secondary | ICD-10-CM | POA: Diagnosis not present

## 2020-07-27 DIAGNOSIS — E7800 Pure hypercholesterolemia, unspecified: Secondary | ICD-10-CM | POA: Diagnosis not present

## 2020-07-27 DIAGNOSIS — F1010 Alcohol abuse, uncomplicated: Secondary | ICD-10-CM | POA: Diagnosis not present

## 2020-07-27 DIAGNOSIS — Z23 Encounter for immunization: Secondary | ICD-10-CM | POA: Diagnosis not present

## 2020-07-27 DIAGNOSIS — R609 Edema, unspecified: Secondary | ICD-10-CM | POA: Diagnosis not present

## 2020-07-27 DIAGNOSIS — Z0000 Encounter for general adult medical examination without abnormal findings: Secondary | ICD-10-CM | POA: Diagnosis not present

## 2020-08-05 DIAGNOSIS — H40013 Open angle with borderline findings, low risk, bilateral: Secondary | ICD-10-CM | POA: Diagnosis not present

## 2020-09-09 DIAGNOSIS — H40013 Open angle with borderline findings, low risk, bilateral: Secondary | ICD-10-CM | POA: Diagnosis not present

## 2020-09-30 DIAGNOSIS — Q8502 Neurofibromatosis, type 2: Secondary | ICD-10-CM | POA: Diagnosis not present

## 2020-10-12 DIAGNOSIS — H903 Sensorineural hearing loss, bilateral: Secondary | ICD-10-CM | POA: Diagnosis not present

## 2020-12-15 ENCOUNTER — Ambulatory Visit
Admission: EM | Admit: 2020-12-15 | Discharge: 2020-12-15 | Disposition: A | Discharge status: Home / Self Care | Payer: BC Managed Care – PPO | Attending: Urgent Care | Admitting: Urgent Care

## 2020-12-15 ENCOUNTER — Other Ambulatory Visit: Payer: Self-pay

## 2020-12-15 DIAGNOSIS — S0501XA Injury of conjunctiva and corneal abrasion without foreign body, right eye, initial encounter: Secondary | ICD-10-CM

## 2020-12-15 MED ORDER — TOBRAMYCIN 0.3 % OP SOLN: 1.0000 [drp] | OPHTHALMIC | 0 refills | Status: DC

## 2020-12-15 NOTE — ED Triage Notes (Signed)
Pt c/o 8/10 burning sharp pain to right eye 2/2 tree limb injury. Eye appears swollen, red/irritated, with minimal clear drainage. Pt states vision is blurry out of affected eye and it appears to be challenging for him to keep the affected eye open.

## 2020-12-15 NOTE — ED Provider Notes (Signed)
[place]   MRN: [id number] DOB: February 23, 1976  Subjective:   Dan Jackson is a 45 y.o. male presenting for suffering a right eye injury today.  Patient states that he was accidentally struck to the right eye while he was fully open today.  He has since had worsening pain, difficulty with his vision, cannot open his eye, has had persistent watering.  Has not tried any medications for relief.  No current facility-administered medications for this encounter.  Current Outpatient Medications:    amLODipine (NORVASC) 5 MG tablet, Take 5 mg by mouth daily., Disp: , Rfl:    hydrochlorothiazide (HYDRODIURIL) 25 MG tablet, Take 25 mg by mouth daily., Disp: , Rfl:    ibuprofen (ADVIL) 800 MG tablet, Take 1 tablet (800 mg total) by mouth every 8 (eight) hours as needed., Disp: 30 tablet, Rfl: 0   olmesartan (BENICAR) 40 MG tablet, Take 40 mg by mouth daily., Disp: , Rfl:    omeprazole (PRILOSEC) 20 MG capsule, Take 20 mg by mouth daily., Disp: , Rfl:    oxyCODONE (OXY IR/ROXICODONE) 5 MG immediate release tablet, Take 1 tablet (5 mg total) by mouth every 6 (six) hours as needed for severe pain., Disp: 20 tablet, Rfl: 0   No Known Allergies  Past Medical History:  Diagnosis Date   GERD (gastroesophageal reflux disease)    History of 2019 novel coronavirus disease (COVID-19) 04/02/2019   positive covid result in epic,  mild symptoms that resolved   History of chemotherapy 01-26-2017  to  02-08-2018   neurofibromatos type 2   Hypertension    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 No pertinent family history.  Social History   Tobacco Use   Smoking status: Former    Years: 10.00    Types: Cigarettes    Quit date: 06/22/2011    Years since quitting: 9.4   Smokeless tobacco: Former    Types: Chew    Quit date: 12/20/2019  Vaping Use   Vaping Use: Never used  Substance Use Topics   Alcohol use: Yes    Alcohol/week: 12.0 standard drinks    Types: 12 Cans of beer per week    Comment: 12 pk beer per week   Drug use: Never    ROS   Objective:   Vitals: BP (!) 146/95 (BP Location: Left Arm)   Pulse 79   Temp 98.5 F (36.9 C) (Oral)   Resp 18   SpO2 95%   Physical Exam Constitutional:      General: He  is not in acute distress.    Appearance: Normal appearance. He is well-developed and normal weight. He is not ill-appearing, toxic-appearing or diaphoretic.  HENT:     Head: Normocephalic and atraumatic.     Right Ear: External ear normal.     Left Ear: External ear normal.     Nose: Nose normal.     Mouth/Throat:     Pharynx: Oropharynx is clear.  Eyes:     General: No scleral icterus.       Right eye: No discharge.        Left eye: No discharge.     Extraocular Movements: Extraocular movements intact.     Pupils: Pupils are equal, round, and reactive to light.   Cardiovascular:     Rate and Rhythm: Normal rate.  Pulmonary:     Effort: Pulmonary effort is normal.  Musculoskeletal:     Cervical back: Normal range of motion.  Neurological:     Mental Status: He is  alert and oriented to person, place, and time.  Psychiatric:        Mood and Affect: Mood normal.        Behavior: Behavior normal.        Thought Content: Thought content normal.        Judgment: Judgment normal.    Eye Exam: Eyelids everted and swept for foreign body. The eye was anesthetized with 2 drops of tetracaine and stained with fluorescein. Examination under woods lamp does reveal an area of increased stain uptake as outlined on the diagram. The eye was then irrigated copiously with saline.   Assessment and Plan :   PDMP not reviewed this encounter.  1. Abrasion of right cornea, initial encounter     Will have patient start tobramycin, use naproxen and or ibuprofen for pain relief.  Follow-up with ophthalmologist tomorrow as soon as possible.  Information given to the patient for 3 different practices. Counseled patient on potential for adverse effects with medications prescribed/recommended today, ER and return-to-clinic precautions discussed, patient verbalized understanding.    Jaynee Eagles, PA-C 12/15/20 1902

## 2020-12-16 DIAGNOSIS — H2000 Unspecified acute and subacute iridocyclitis: Secondary | ICD-10-CM | POA: Diagnosis not present

## 2020-12-16 DIAGNOSIS — S0501XA Injury of conjunctiva and corneal abrasion without foreign body, right eye, initial encounter: Secondary | ICD-10-CM | POA: Diagnosis not present

## 2020-12-17 DIAGNOSIS — S0501XD Injury of conjunctiva and corneal abrasion without foreign body, right eye, subsequent encounter: Secondary | ICD-10-CM | POA: Diagnosis not present

## 2020-12-17 DIAGNOSIS — H2000 Unspecified acute and subacute iridocyclitis: Secondary | ICD-10-CM | POA: Diagnosis not present

## 2020-12-24 DIAGNOSIS — S0501XD Injury of conjunctiva and corneal abrasion without foreign body, right eye, subsequent encounter: Secondary | ICD-10-CM | POA: Diagnosis not present

## 2020-12-24 DIAGNOSIS — H2000 Unspecified acute and subacute iridocyclitis: Secondary | ICD-10-CM | POA: Diagnosis not present

## 2020-12-24 DIAGNOSIS — H40051 Ocular hypertension, right eye: Secondary | ICD-10-CM | POA: Diagnosis not present

## 2021-01-05 DIAGNOSIS — G4733 Obstructive sleep apnea (adult) (pediatric): Secondary | ICD-10-CM | POA: Diagnosis not present

## 2021-01-11 DIAGNOSIS — G4733 Obstructive sleep apnea (adult) (pediatric): Secondary | ICD-10-CM | POA: Diagnosis not present

## 2021-01-11 DIAGNOSIS — G4700 Insomnia, unspecified: Secondary | ICD-10-CM | POA: Diagnosis not present

## 2021-01-31 DIAGNOSIS — R609 Edema, unspecified: Secondary | ICD-10-CM | POA: Diagnosis not present

## 2021-01-31 DIAGNOSIS — E7800 Pure hypercholesterolemia, unspecified: Secondary | ICD-10-CM | POA: Diagnosis not present

## 2021-01-31 DIAGNOSIS — F1010 Alcohol abuse, uncomplicated: Secondary | ICD-10-CM | POA: Diagnosis not present

## 2021-01-31 DIAGNOSIS — Z23 Encounter for immunization: Secondary | ICD-10-CM | POA: Diagnosis not present

## 2021-01-31 DIAGNOSIS — I10 Essential (primary) hypertension: Secondary | ICD-10-CM | POA: Diagnosis not present

## 2021-03-08 DIAGNOSIS — F4323 Adjustment disorder with mixed anxiety and depressed mood: Secondary | ICD-10-CM | POA: Diagnosis not present

## 2021-03-09 DIAGNOSIS — Q8502 Neurofibromatosis, type 2: Secondary | ICD-10-CM | POA: Diagnosis not present

## 2021-03-14 DIAGNOSIS — G4733 Obstructive sleep apnea (adult) (pediatric): Secondary | ICD-10-CM | POA: Diagnosis not present

## 2021-03-21 DIAGNOSIS — F4323 Adjustment disorder with mixed anxiety and depressed mood: Secondary | ICD-10-CM | POA: Diagnosis not present

## 2021-04-12 DIAGNOSIS — K5730 Diverticulosis of large intestine without perforation or abscess without bleeding: Secondary | ICD-10-CM | POA: Diagnosis not present

## 2021-04-12 DIAGNOSIS — Z1211 Encounter for screening for malignant neoplasm of colon: Secondary | ICD-10-CM | POA: Diagnosis not present

## 2021-04-12 DIAGNOSIS — D120 Benign neoplasm of cecum: Secondary | ICD-10-CM | POA: Diagnosis not present

## 2021-04-12 DIAGNOSIS — K621 Rectal polyp: Secondary | ICD-10-CM | POA: Diagnosis not present

## 2021-04-12 DIAGNOSIS — D122 Benign neoplasm of ascending colon: Secondary | ICD-10-CM | POA: Diagnosis not present

## 2021-04-12 DIAGNOSIS — K648 Other hemorrhoids: Secondary | ICD-10-CM | POA: Diagnosis not present

## 2021-04-13 DIAGNOSIS — G4733 Obstructive sleep apnea (adult) (pediatric): Secondary | ICD-10-CM | POA: Diagnosis not present

## 2021-04-13 DIAGNOSIS — F4323 Adjustment disorder with mixed anxiety and depressed mood: Secondary | ICD-10-CM | POA: Diagnosis not present

## 2021-04-18 DIAGNOSIS — G4733 Obstructive sleep apnea (adult) (pediatric): Secondary | ICD-10-CM | POA: Diagnosis not present

## 2021-05-04 DIAGNOSIS — G4733 Obstructive sleep apnea (adult) (pediatric): Secondary | ICD-10-CM | POA: Diagnosis not present

## 2021-12-26 ENCOUNTER — Ambulatory Visit: Payer: BC Managed Care – PPO | Admitting: Internal Medicine

## 2023-06-30 ENCOUNTER — Other Ambulatory Visit: Payer: Self-pay

## 2023-06-30 ENCOUNTER — Encounter (HOSPITAL_BASED_OUTPATIENT_CLINIC_OR_DEPARTMENT_OTHER): Payer: Self-pay | Admitting: *Deleted

## 2023-06-30 ENCOUNTER — Observation Stay (HOSPITAL_BASED_OUTPATIENT_CLINIC_OR_DEPARTMENT_OTHER)
Admission: EM | Admit: 2023-06-30 | Discharge: 2023-07-01 | Disposition: A | Discharge status: Home / Self Care | Payer: BLUE CROSS/BLUE SHIELD | Attending: General Surgery | Admitting: General Surgery

## 2023-06-30 ENCOUNTER — Emergency Department (HOSPITAL_BASED_OUTPATIENT_CLINIC_OR_DEPARTMENT_OTHER): Payer: BLUE CROSS/BLUE SHIELD

## 2023-06-30 ENCOUNTER — Other Ambulatory Visit: Payer: Self-pay | Admitting: General Surgery

## 2023-06-30 DIAGNOSIS — Z8616 Personal history of COVID-19: Secondary | ICD-10-CM | POA: Insufficient documentation

## 2023-06-30 DIAGNOSIS — K3580 Unspecified acute appendicitis: Principal | ICD-10-CM | POA: Insufficient documentation

## 2023-06-30 DIAGNOSIS — Z79899 Other long term (current) drug therapy: Secondary | ICD-10-CM | POA: Insufficient documentation

## 2023-06-30 DIAGNOSIS — Z87891 Personal history of nicotine dependence: Secondary | ICD-10-CM | POA: Diagnosis not present

## 2023-06-30 DIAGNOSIS — K3589 Other acute appendicitis without perforation or gangrene: Secondary | ICD-10-CM

## 2023-06-30 DIAGNOSIS — I10 Essential (primary) hypertension: Secondary | ICD-10-CM | POA: Diagnosis not present

## 2023-06-30 DIAGNOSIS — R1031 Right lower quadrant pain: Secondary | ICD-10-CM | POA: Diagnosis present

## 2023-06-30 LAB — LIPASE, BLOOD: Lipase: 39 U/L (ref 11–51)

## 2023-06-30 LAB — COMPREHENSIVE METABOLIC PANEL
ALT: 45 U/L — ABNORMAL HIGH (ref 0–44)
AST: 33 U/L (ref 15–41)
Albumin: 4.3 g/dL (ref 3.5–5.0)
Alkaline Phosphatase: 60 U/L (ref 38–126)
Anion gap: 11 (ref 5–15)
BUN: 15 mg/dL (ref 6–20)
CO2: 24 mmol/L (ref 22–32)
Calcium: 9.3 mg/dL (ref 8.9–10.3)
Chloride: 103 mmol/L (ref 98–111)
Creatinine, Ser: 1.09 mg/dL (ref 0.61–1.24)
GFR, Estimated: >60 mL/min (ref >60)
Glucose, Bld: 105 mg/dL — ABNORMAL HIGH (ref 70–99)
Potassium: 3.6 mmol/L (ref 3.5–5.1)
Sodium: 138 mmol/L (ref 135–145)
Total Bilirubin: 0.5 mg/dL (ref 0.0–1.2)
Total Protein: 7.3 g/dL (ref 6.5–8.1)

## 2023-06-30 LAB — CBC
HCT: 44.6 % (ref 39.0–52.0)
Hemoglobin: 15.1 g/dL (ref 13.0–17.0)
MCH: 29.7 pg (ref 26.0–34.0)
MCHC: 33.9 g/dL (ref 30.0–36.0)
MCV: 87.8 fL (ref 80.0–100.0)
Platelets: 277 10*3/uL (ref 150–400)
RBC: 5.08 MIL/uL (ref 4.22–5.81)
RDW: 12.7 % (ref 11.5–15.5)
WBC: 15.5 10*3/uL — ABNORMAL HIGH (ref 4.0–10.5)
nRBC: 0 % (ref 0.0–0.2)

## 2023-06-30 LAB — URINALYSIS, ROUTINE W REFLEX MICROSCOPIC
Bilirubin Urine: NEGATIVE
Glucose, UA: NEGATIVE mg/dL
Hgb urine dipstick: NEGATIVE
Ketones, ur: NEGATIVE mg/dL
Leukocytes,Ua: NEGATIVE
Nitrite: NEGATIVE
Protein, ur: NEGATIVE mg/dL
Specific Gravity, Urine: >=1.03 (ref 1.005–1.030)
pH: 5.5 (ref 5.0–8.0)

## 2023-06-30 MED ORDER — METRONIDAZOLE 500 MG/100ML IV SOLN
500.0000 mg | Freq: Once | INTRAVENOUS | Status: AC
  Administered 2023-06-30: 500 mg via INTRAVENOUS
  Filled 2023-06-30: qty 100

## 2023-06-30 MED ORDER — SODIUM CHLORIDE 0.9 % IV SOLN
2.0000 g | Freq: Once | INTRAVENOUS | Status: AC
  Administered 2023-06-30: 2 g via INTRAVENOUS
  Filled 2023-06-30: qty 20

## 2023-06-30 MED ORDER — IOHEXOL 300 MG/ML  SOLN
100.0000 mL | Freq: Once | INTRAMUSCULAR | Status: AC | PRN
  Administered 2023-06-30: 100 mL via INTRAVENOUS

## 2023-06-30 MED ORDER — FENTANYL CITRATE PF 50 MCG/ML IJ SOSY
50.0000 ug | PREFILLED_SYRINGE | Freq: Once | INTRAMUSCULAR | Status: AC
  Administered 2023-06-30: 50 ug via INTRAVENOUS
  Filled 2023-06-30: qty 1

## 2023-06-30 MED ORDER — ACETAMINOPHEN 500 MG PO TABS
1000.0000 mg | ORAL_TABLET | Freq: Once | ORAL | Status: AC
  Administered 2023-06-30: 1000 mg via ORAL
  Filled 2023-06-30: qty 2

## 2023-06-30 NOTE — ED Triage Notes (Signed)
 Pt is here for sharp and severe abdominal pain which began around 15:30 and has been associated with nausea.  Abdomen feels distended.  No urinary symptoms with

## 2023-06-30 NOTE — H&P (Signed)
 Dan Jackson is an 48 y.o. male.   Chief Complaint: abdominal pain HPI:  Pt presents with <24 hours of worsening abdominal pain, now worsened to the RLQ.  He had some mild nausea, but no vomiting.  He was able to eat.  He hadn't had pain like that before so he went to the ED.  He had increased pain with moving.  Nothing he did made the pain better.  He had no fever/chills.    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 No pertinent family history. Social History:  reports that he quit smoking about 12 years ago. His smoking use included cigarettes. He started smoking about 22 years ago. He quit smokeless tobacco use about 3 years ago.  His smokeless tobacco use included chew. He reports current alcohol use of about 12.0 standard  drinks of alcohol per week. He reports that he does not use drugs.  Allergies: No Known Allergies  Meds: Amlodipine Hydrochlorothiazide Ibuprofen Benicar prilosec   Results for orders placed or performed during the hospital encounter of 06/30/23 (from the past 48 hours)  Lipase, blood     Status: None   Collection Time: 06/30/23  5:41 PM  Result Value Ref Range   Lipase 39 11 - 51 U/L    Comment: Performed at [facility], [address] [place], [place] [address]  Comprehensive metabolic panel     Status: Abnormal   Collection Time: 06/30/23  5:41 PM  Result Value Ref Range   Sodium 138 135 - 145 mmol/L   Potassium 3.6 3.5 - 5.1 mmol/L   Chloride 103 98 - 111 mmol/L   CO2 24 22 - 32 mmol/L   Glucose, Bld 105 (H) 70 - 99 mg/dL    Comment: Glucose reference range applies only to samples taken after fasting for at least 8 hours.   BUN 15 6 - 20 mg/dL   Creatinine, Ser 6.04 0.61 - 1.24 mg/dL   Calcium 9.3 8.9 - 54.0 mg/dL   Total Protein 7.3 6.5 - 8.1 g/dL   Albumin 4.3 3.5 - 5.0 g/dL   AST 33 15 - 41 U/L   ALT 45 (H) 0 - 44 U/L   Alkaline  Phosphatase 60 38 - 126 U/L   Total Bilirubin 0.5 0.0 - 1.2 mg/dL   GFR, Estimated >40 >98 mL/min    Comment: (NOTE) Calculated using the CKD-EPI Creatinine Equation (2021)    Anion gap 11 5 - 15    Comment: Performed at [facility], [address] [place], [place] [address]  CBC     Status: Abnormal   Collection Time: 06/30/23  5:41 PM  Result Value Ref Range   WBC 15.5 (H) 4.0 - 10.5 K/uL   RBC 5.08 4.22 - 5.81 MIL/uL   Hemoglobin 15.1 13.0 - 17.0 g/dL   HCT 78.2 95.6 - 21.3 %   MCV 87.8 80.0 - 100.0 fL   MCH 29.7 26.0 - 34.0 pg   MCHC 33.9 30.0 - 36.0 g/dL   RDW 08.6 57.8 - 46.9 %   Platelets 277 150 - 400 K/uL   nRBC 0.0 0.0 - 0.2 %    Comment: Performed at [facility], [address] [place], [place] [address]  Urinalysis, Routine w reflex microscopic -Urine, Clean Catch     Status:  None   Collection Time: 06/30/23  5:42 PM  Result Value Ref Range   Color, Urine YELLOW YELLOW   APPearance CLEAR CLEAR   Specific Gravity, Urine >=1.030 1.005 - 1.030   pH 5.5 5.0 - 8.0   Glucose, UA NEGATIVE NEGATIVE mg/dL   Hgb urine dipstick NEGATIVE NEGATIVE   Bilirubin Urine NEGATIVE NEGATIVE   Ketones, ur NEGATIVE NEGATIVE mg/dL   Protein, ur NEGATIVE NEGATIVE mg/dL   Nitrite NEGATIVE NEGATIVE   Leukocytes,Ua NEGATIVE NEGATIVE    Comment: Microscopic not done on urines with negative protein, blood, leukocytes, nitrite, or glucose < 500 mg/dL. Performed at [facility], [address] [place], [place] [address]   MRSA Next Gen by PCR, Nasal     Status: None   Collection Time: 07/01/23  1:46 AM   Specimen: Nasal Mucosa; Nasal Swab  Result Value Ref Range   MRSA by PCR Next Gen NOT DETECTED NOT DETECTED    Comment: (NOTE) The GeneXpert MRSA Assay (FDA approved for NASAL specimens only), is one component of a comprehensive MRSA colonization surveillance program. It is not intended to diagnose MRSA infection nor to guide or monitor treatment for MRSA infections. Test performance is not FDA approved in patients less than 34 years old. Performed at [facility], [address], [place], [place] [address]    CT ABDOMEN PELVIS W CONTRAST Result Date: 06/30/2023 CLINICAL DATA:  Acute abdominal pain, nonlocalized. Starting today. Nausea. EXAM: CT ABDOMEN AND PELVIS WITH CONTRAST TECHNIQUE: Multidetector CT imaging of the abdomen and pelvis was performed using the standard protocol following bolus administration of intravenous contrast. RADIATION DOSE REDUCTION: This exam was performed according to the departmental dose-optimization program which includes automated exposure control, adjustment of the mA and/or kV according to patient size and/or use of iterative reconstruction technique. CONTRAST:  OMNIPAQUE IOHEXOL 300 MG/ML  SOLN COMPARISON:  CT  abdomen 12/03/2003 FINDINGS: Lower chest: No acute abnormality. Hepatobiliary: Smooth liver contours. There is again diffuse decreased density throughout the liver suggesting fatty infiltration. The gallbladder is unremarkable. No intrahepatic or extrahepatic biliary ductal dilatation. Pancreas: Unremarkable. No pancreatic ductal dilatation or surrounding inflammatory changes. Spleen: Normal in size without focal abnormality. Adrenals/Urinary Tract: Normal adrenals. The kidneys enhance uniformly and are symmetric in size without hydronephrosis. New 13 mm right upper pole parapelvic cyst compared  to remote 12/03/2003 CT. New left lower pole exophytic low-density lesion with internal density measuring 29-33 Hounsfield units, measuring up to 5.8 x 4.5 x 5.2 cm (transverse by AP by craniocaudal). This is favored to represent an exophytic mildly complex cyst. Consider nonemergent renal ultrasound to ensure this is a cyst and not a solid mass. The bilateral ureters are normal in caliber. No focal urinary bladder wall thickening is seen. Stomach/Bowel: Mild descending colon diverticulosis without inflammatory changes to indicate acute diverticulitis. The terminal ileum is unremarkable. There is new mild enlargement of the distal appendix (axial series 301 image 60 and coronal series 601, image 89) with increased peripheral wall enhancement and mild surrounding inflammatory stranding consistent with acute appendicitis. No dilated loops of small bowel are seen to indicate bowel obstruction. Vascular/Lymphatic: No significant vascular findings are present. No enlarged abdominal or pelvic lymph nodes. Reproductive: The prostate and seminal vesicles are grossly unremarkable. Other: No ventral abdominal wall hernia. No free air or free fluid is seen within the abdomen or pelvis. Musculoskeletal: Mild posterior L5-S1 disc space narrowing and endplate sclerosis. Moderate bilateral sacroiliac joint space narrowing and diffuse  anterior bridging osteophytosis. IMPRESSION: 1. Acute appendicitis. No evidence of perforation or abscess. No drainable fluid collection. 2. Mild descending colon diverticulosis without inflammatory changes to indicate acute diverticulitis. 3. New 5.8 cm left lower pole exophytic low-density lesion with internal density measuring 29-33 Hounsfield units. This is favored to represent an exophytic mildly complex cyst. Consider nonemergent renal ultrasound to ensure this is a cyst and not a solid mass. 4. Hepatic steatosis. Electronically Signed   By: Neita Garnet M.D.   On: 06/30/2023 19:37    Review of Systems  All other systems reviewed and are negative.   Blood pressure 116/73, pulse 69, temperature 98.1 F (36.7 C), resp. rate 16, height 6\' 2"  (1.88 m), weight 120.1 kg, SpO2 100%. Physical Exam Vitals reviewed.  Constitutional:      Appearance: He is well-developed.  HENT:     Head: Normocephalic.     Mouth/Throat:     Pharynx: Oropharynx is clear.  Eyes:     General: No scleral icterus.    Extraocular Movements: Extraocular movements intact.     Pupils: Pupils are equal, round, and reactive to light.  Cardiovascular:     Rate and Rhythm: Normal rate and regular rhythm.  Pulmonary:     Effort: Pulmonary effort is normal. No respiratory distress.  Chest:     Chest wall: No tenderness.  Abdominal:     General: Abdomen is protuberant.     Palpations: Abdomen is soft. There is no shifting dullness, fluid wave, hepatomegaly, splenomegaly or mass.     Tenderness: There is abdominal tenderness in the right lower quadrant. There is no guarding or rebound. Positive signs include McBurney's sign.     Hernia: No hernia is present.     Comments: Small infraumbilical hernia scar ("smiley face")  Skin:    General: Skin is warm and dry.     Capillary Refill: Capillary refill takes 2 to 3 seconds.     Coloration: Skin is not jaundiced, mottled or pale.  Neurological:     General: No focal  deficit present.     Mental Status: He is alert.  Psychiatric:        Mood and Affect: Mood normal.        Behavior: Behavior normal.      Assessment/Plan Acute appendicitis HTN  IV fluids IV antibiotics NPO VTE ppx -  lovenox  OR for lap appy.    Appendectomy was described to the patient.  The incisions and surgical technique were explained.  The patient was advised that some of the hair on the abdomen would be clipped, and that a foley catheter would be placed.  I advised the patient of the risks of surgery including, but not limited to, bleeding, infection, damage to other structures, risk of an open operation, risk of abscess, and risk of blood clot.  The recovery was also described to the patient.  He was advised that he will have lifting restrictions for 2 weeks.    I will plan to do an Optiview trocar in the left upper quadrant in order to avoid placing a trocar directly through his umbilical hernia mesh.  I discussed that this incision will be more sore as I will need to stretch it a bit to get the specimen out.  Patient's girlfriend was present at bedside  Almond Lint, MD 07/01/2023, 7:40 AM

## 2023-06-30 NOTE — ED Provider Notes (Signed)
 [facility] Provider Note   CSN: [id number] Arrival date & time: 06/30/23  1653     History  Chief Complaint  Patient presents with   Abdominal Pain    Dan Jackson is a 48 y.o. male.  Patient here with right lower abdominal pain.  History of hypertension diabetes.  Pain started earlier this afternoon getting worse.  No major nausea vomiting diarrhea.  Patient has had hernia surgery in the past.  Denies any fever chills weakness numbness tingling.  Denies any pain urination.  The history is provided by the patient.       Home Medications Prior to Admission medications   Medication Sig Start Date End Date Taking? Authorizing Provider  amLODipine (NORVASC) 5 MG tablet Take 5 mg by mouth daily.    [provider]  hydrochlorothiazide (HYDRODIURIL) 25 MG tablet Take 25 mg by mouth daily.    [provider]  ibuprofen (ADVIL) 800 MG tablet Take 1 tablet (800 mg total) by mouth every 8 (eight) hours as needed. 06/24/20   Kinsinger, De Blanch, MD  olmesartan (BENICAR) 40 MG tablet Take 40 mg by mouth daily.    [provider]  omeprazole (PRILOSEC) 20 MG capsule Take 20 mg by mouth daily.    [provider]  oxyCODONE (OXY IR/ROXICODONE) 5 MG immediate release tablet Take 1 tablet (5 mg total) by mouth every 6 (six) hours as needed for severe pain. 06/24/20   Kinsinger, De Blanch, MD  tobramycin (TOBREX) 0.3 % ophthalmic solution Place 1 drop into the right eye every 2 (two) hours. 12/15/20   Wallis Bamberg, PA-C      Allergies    Patient has no known allergies.    Review of Systems   Review of Systems  Physical Exam Updated Vital Signs BP 113/71   Pulse 95   Temp 98.8 F (37.1 C) (Oral)   Resp 18   SpO2 98%  Physical Exam Vitals and nursing note reviewed.  Constitutional:      General: He is not in acute distress.    Appearance: He is well-developed. He is not ill-appearing.  HENT:      Head: Normocephalic and atraumatic.     Mouth/Throat:     Mouth: Mucous membranes are moist.  Eyes:     Extraocular Movements: Extraocular movements intact.     Conjunctiva/sclera: Conjunctivae normal.     Pupils: Pupils are equal, round, and reactive to light.  Cardiovascular:     Rate and Rhythm: Normal rate and regular rhythm.     Heart sounds: Normal heart sounds. No murmur heard. Pulmonary:     Effort: Pulmonary effort is normal. No respiratory distress.     Breath sounds: Normal breath sounds.  Abdominal:     Palpations: Abdomen is soft.     Tenderness: There is abdominal tenderness in the right lower quadrant.  Musculoskeletal:        General: No swelling.     Cervical back: Neck supple.  Skin:    General: Skin is warm and dry.     Capillary Refill: Capillary refill takes less than 2 seconds.  Neurological:     Mental Status: He is alert.  Psychiatric:        Mood and Affect: Mood normal.     ED Results / Procedures / Treatments   Labs (all labs ordered are listed, but only abnormal results are displayed) Labs Reviewed  COMPREHENSIVE METABOLIC PANEL - Abnormal; Notable  for the following components:      Result Value   Glucose, Bld 105 (*)    ALT 45 (*)    All other components within normal limits  CBC - Abnormal; Notable for the following components:   WBC 15.5 (*)    All other components within normal limits  LIPASE, BLOOD  URINALYSIS, ROUTINE W REFLEX MICROSCOPIC    EKG None  Radiology CT ABDOMEN PELVIS W CONTRAST Result Date: 06/30/2023 CLINICAL DATA:  Acute abdominal pain, nonlocalized. Starting today. Nausea. EXAM: CT ABDOMEN AND PELVIS WITH CONTRAST TECHNIQUE: Multidetector CT imaging of the abdomen and pelvis was performed using the standard protocol following bolus administration of intravenous contrast. RADIATION DOSE REDUCTION: This exam was performed according to the departmental dose-optimization program which includes automated exposure control,  adjustment of the mA and/or kV according to patient size and/or use of iterative reconstruction technique. CONTRAST:  OMNIPAQUE IOHEXOL 300 MG/ML  SOLN COMPARISON:  CT abdomen 12/03/2003 FINDINGS: Lower chest: No acute abnormality. Hepatobiliary: Smooth liver contours. There is again diffuse decreased density throughout the liver suggesting fatty infiltration. The gallbladder is unremarkable. No intrahepatic or extrahepatic biliary ductal dilatation. Pancreas: Unremarkable. No pancreatic ductal dilatation or surrounding inflammatory changes. Spleen: Normal in size without focal abnormality. Adrenals/Urinary Tract: Normal adrenals. The kidneys enhance uniformly and are symmetric in size without hydronephrosis. New 13 mm right upper pole parapelvic cyst compared to remote 12/03/2003 CT. New left lower pole exophytic low-density lesion with internal density measuring 29-33 Hounsfield units, measuring up to 5.8 x 4.5 x 5.2 cm (transverse by AP by craniocaudal). This is favored to represent an exophytic mildly complex cyst. Consider nonemergent renal ultrasound to ensure this is a cyst and not a solid mass. The bilateral ureters are normal in caliber. No focal urinary bladder wall thickening is seen. Stomach/Bowel: Mild descending colon diverticulosis without inflammatory changes to indicate acute diverticulitis. The terminal ileum is unremarkable. There is new mild enlargement of the distal appendix (axial series 301 image 60 and coronal series 601, image 89) with increased peripheral wall enhancement and mild surrounding inflammatory stranding consistent with acute appendicitis. No dilated loops of small bowel are seen to indicate bowel obstruction. Vascular/Lymphatic: No significant vascular findings are present. No enlarged abdominal or pelvic lymph nodes. Reproductive: The prostate and seminal vesicles are grossly unremarkable. Other: No ventral abdominal wall hernia. No free air or free fluid is seen within  the abdomen or pelvis. Musculoskeletal: Mild posterior L5-S1 disc space narrowing and endplate sclerosis. Moderate bilateral sacroiliac joint space narrowing and diffuse anterior bridging osteophytosis. IMPRESSION: 1. Acute appendicitis. No evidence of perforation or abscess. No drainable fluid collection. 2. Mild descending colon diverticulosis without inflammatory changes to indicate acute diverticulitis. 3. New 5.8 cm left lower pole exophytic low-density lesion with internal density measuring 29-33 Hounsfield units. This is favored to represent an exophytic mildly complex cyst. Consider nonemergent renal ultrasound to ensure this is a cyst and not a solid mass. 4. Hepatic steatosis. Electronically Signed   By: Neita Garnet M.D.   On: 06/30/2023 19:37    Procedures Procedures    Medications Ordered in ED Medications  cefTRIAXone (ROCEPHIN) 2 g in sodium chloride 0.9 % 100 mL IVPB (0 g Intravenous Stopped 06/30/23 2107)    And  metroNIDAZOLE (FLAGYL) IVPB 500 mg (500 mg Intravenous New Bag/Given 06/30/23 2107)  fentaNYL (SUBLIMAZE) injection 50 mcg (has no administration in time range)  iohexol (OMNIPAQUE) 300 MG/ML solution 100 mL (100 mLs Intravenous Contrast Given 06/30/23 1838)  ED Course/ Medical Decision Making/ A&P                                 Medical Decision Making Amount and/or Complexity of Data Reviewed Labs: ordered.  Risk Prescription drug management. Decision regarding hospitalization.   Dan Jackson is here with right lower quadrant abdominal pain.  Normal vitals.  No fever.  Differential diagnosis appendicitis versus colitis versus UTI.  Will get CBC CMP lipase urinalysis CT scan abdomen pelvis.  Per my review and interpretation of labs there is no significant leukocytosis anemia electrolyte abnormality kidney injury.  CT scan per radiology report does show acute appendicitis.  No complications otherwise.  Urinalysis negative for infection.  Will start him on IV  antibiotics make him n.p.o. at midnight.  Talked with Dr. Donell Beers with general surgery will admit to surgery service will go to the OR in a.m.  Hemodynamically stable throughout my care.  This chart was dictated using voice recognition software.  Despite best efforts to proofread,  errors can occur which can change the documentation meaning.         Final Clinical Impression(s) / ED Diagnoses Final diagnoses:  Acute appendicitis, unspecified acute appendicitis type    Rx / DC Orders ED Discharge Orders     None         Virgina Norfolk, DO 06/30/23 2112

## 2023-06-30 NOTE — ED Notes (Signed)
   06/30/23 1703  Respiratory Assessment  $ RT Protocol Assessment  Yes  Assessment Type Assess only  Respiratory Pattern Regular;Unlabored;Symmetrical  Chest Assessment Chest expansion symmetrical  Bilateral Breath Sounds Clear  Oxygen Therapy/Pulse Ox  O2 Therapy Room air  SpO2 99 %   Seen in lobby before triage.  No resp distress noted, denies [facility] with exertion. "I'm unable to get a deep breath" per patient.

## 2023-06-30 NOTE — ED Notes (Signed)
 [facility] called for transport.

## 2023-07-01 ENCOUNTER — Encounter (HOSPITAL_COMMUNITY)
Admission: EM | Disposition: A | Discharge status: Home / Self Care | Payer: Self-pay | Source: Home / Self Care | Attending: Emergency Medicine

## 2023-07-01 ENCOUNTER — Observation Stay (HOSPITAL_COMMUNITY): Payer: BLUE CROSS/BLUE SHIELD | Admitting: Certified Registered"

## 2023-07-01 ENCOUNTER — Other Ambulatory Visit: Payer: Self-pay

## 2023-07-01 DIAGNOSIS — R1031 Right lower quadrant pain: Secondary | ICD-10-CM | POA: Diagnosis present

## 2023-07-01 DIAGNOSIS — Z79899 Other long term (current) drug therapy: Secondary | ICD-10-CM | POA: Diagnosis not present

## 2023-07-01 DIAGNOSIS — K3580 Unspecified acute appendicitis: Secondary | ICD-10-CM | POA: Diagnosis not present

## 2023-07-01 DIAGNOSIS — I10 Essential (primary) hypertension: Secondary | ICD-10-CM | POA: Diagnosis not present

## 2023-07-01 DIAGNOSIS — Z8616 Personal history of COVID-19: Secondary | ICD-10-CM | POA: Diagnosis not present

## 2023-07-01 DIAGNOSIS — Z87891 Personal history of nicotine dependence: Secondary | ICD-10-CM | POA: Diagnosis not present

## 2023-07-01 HISTORY — PX: LAPAROSCOPIC APPENDECTOMY: SHX408

## 2023-07-01 LAB — MRSA NEXT GEN BY PCR, NASAL: MRSA by PCR Next Gen: NOT DETECTED

## 2023-07-01 SURGERY — APPENDECTOMY, LAPAROSCOPIC
Anesthesia: General | Site: Abdomen

## 2023-07-01 MED ORDER — OXYCODONE HCL 5 MG PO TABS: 5.0000 mg | ORAL_TABLET | Freq: Four times a day (QID) | ORAL | 0 refills | Status: AC | PRN

## 2023-07-01 MED ORDER — ROCURONIUM BROMIDE 10 MG/ML (PF) SYRINGE
PREFILLED_SYRINGE | INTRAVENOUS | Status: DC | PRN
  Administered 2023-07-01: 70 mg via INTRAVENOUS

## 2023-07-01 MED ORDER — LIDOCAINE HCL (PF) 2 % IJ SOLN
INTRAMUSCULAR | Status: DC | PRN
  Administered 2023-07-01: 100 mg via INTRADERMAL

## 2023-07-01 MED ORDER — LIDOCAINE HCL (PF) 1 % IJ SOLN
INTRAMUSCULAR | Status: AC
  Filled 2023-07-01: qty 30

## 2023-07-01 MED ORDER — LACTATED RINGERS IV SOLN: INTRAVENOUS | Status: DC | PRN

## 2023-07-01 MED ORDER — ACETAMINOPHEN 10 MG/ML IV SOLN: 1000.0000 mg | Freq: Once | INTRAVENOUS | Status: DC | PRN

## 2023-07-01 MED ORDER — BUPIVACAINE-EPINEPHRINE 0.25% -1:200000 IJ SOLN
INTRAMUSCULAR | Status: AC
  Filled 2023-07-01: qty 1

## 2023-07-01 MED ORDER — DEXAMETHASONE SODIUM PHOSPHATE 10 MG/ML IJ SOLN
INTRAMUSCULAR | Status: DC | PRN
  Administered 2023-07-01: 4 mg via INTRAVENOUS

## 2023-07-01 MED ORDER — OXYCODONE HCL 5 MG PO TABS
ORAL_TABLET | ORAL | Status: AC
  Filled 2023-07-01: qty 1

## 2023-07-01 MED ORDER — KCL IN DEXTROSE-NACL 20-5-0.45 MEQ/L-%-% IV SOLN
INTRAVENOUS | Status: DC
Start: 2023-07-01 — End: 2023-07-01
  Filled 2023-07-01 (×2): qty 1000

## 2023-07-01 MED ORDER — ROCURONIUM BROMIDE 10 MG/ML (PF) SYRINGE
PREFILLED_SYRINGE | INTRAVENOUS | Status: AC
  Filled 2023-07-01: qty 10

## 2023-07-01 MED ORDER — PHENYLEPHRINE 80 MCG/ML (10ML) SYRINGE FOR IV PUSH (FOR BLOOD PRESSURE SUPPORT)
PREFILLED_SYRINGE | INTRAVENOUS | Status: DC | PRN
  Administered 2023-07-01: 160 ug via INTRAVENOUS

## 2023-07-01 MED ORDER — METRONIDAZOLE 500 MG/100ML IV SOLN
500.0000 mg | Freq: Two times a day (BID) | INTRAVENOUS | Status: DC
  Filled 2023-07-01: qty 100

## 2023-07-01 MED ORDER — SENNA 8.6 MG PO TABS
1.0000 | ORAL_TABLET | Freq: Two times a day (BID) | ORAL | Status: DC
  Administered 2023-07-01: 8.6 mg via ORAL
  Filled 2023-07-01: qty 1

## 2023-07-01 MED ORDER — PROPOFOL 10 MG/ML IV BOLUS
INTRAVENOUS | Status: AC
  Filled 2023-07-01: qty 20

## 2023-07-01 MED ORDER — ONDANSETRON HCL 4 MG/2ML IJ SOLN: 4.0000 mg | Freq: Four times a day (QID) | INTRAMUSCULAR | Status: DC | PRN

## 2023-07-01 MED ORDER — LIDOCAINE HCL (PF) 2 % IJ SOLN
INTRAMUSCULAR | Status: AC
  Filled 2023-07-01: qty 5

## 2023-07-01 MED ORDER — MORPHINE SULFATE (PF) 2 MG/ML IV SOLN: 1.0000 mg | INTRAVENOUS | Status: DC | PRN

## 2023-07-01 MED ORDER — FENTANYL CITRATE (PF) 100 MCG/2ML IJ SOLN
INTRAMUSCULAR | Status: DC | PRN
  Administered 2023-07-01: 100 ug via INTRAVENOUS

## 2023-07-01 MED ORDER — DEXAMETHASONE SODIUM PHOSPHATE 10 MG/ML IJ SOLN
INTRAMUSCULAR | Status: AC
Start: 2023-07-01 — End: ?
  Filled 2023-07-01: qty 1

## 2023-07-01 MED ORDER — OXYCODONE HCL 5 MG PO TABS
5.0000 mg | ORAL_TABLET | Freq: Once | ORAL | Status: AC | PRN
  Administered 2023-07-01: 5 mg via ORAL

## 2023-07-01 MED ORDER — METHOCARBAMOL 500 MG PO TABS
500.0000 mg | ORAL_TABLET | Freq: Three times a day (TID) | ORAL | Status: DC | PRN
  Administered 2023-07-01: 500 mg via ORAL
  Filled 2023-07-01: qty 1

## 2023-07-01 MED ORDER — LIDOCAINE HCL (PF) 1 % IJ SOLN
INTRAMUSCULAR | Status: DC | PRN
  Administered 2023-07-01: 16 mL

## 2023-07-01 MED ORDER — OXYCODONE HCL 5 MG PO TABS
5.0000 mg | ORAL_TABLET | ORAL | Status: DC | PRN
  Administered 2023-07-01 (×2): 5 mg via ORAL
  Filled 2023-07-01 (×2): qty 1

## 2023-07-01 MED ORDER — METHOCARBAMOL 1000 MG/10ML IJ SOLN: 500.0000 mg | Freq: Three times a day (TID) | INTRAMUSCULAR | Status: DC | PRN

## 2023-07-01 MED ORDER — ONDANSETRON 4 MG PO TBDP: 4.0000 mg | ORAL_TABLET | Freq: Four times a day (QID) | ORAL | 0 refills | Status: AC | PRN

## 2023-07-01 MED ORDER — METRONIDAZOLE 500 MG/100ML IV SOLN
INTRAVENOUS | Status: DC | PRN
  Administered 2023-07-01: 500 mg via INTRAVENOUS

## 2023-07-01 MED ORDER — PROPOFOL 10 MG/ML IV BOLUS
INTRAVENOUS | Status: DC | PRN
  Administered 2023-07-01: 200 mg via INTRAVENOUS

## 2023-07-01 MED ORDER — DROPERIDOL 2.5 MG/ML IJ SOLN: 0.6250 mg | Freq: Once | INTRAMUSCULAR | Status: DC | PRN

## 2023-07-01 MED ORDER — PHENYLEPHRINE 80 MCG/ML (10ML) SYRINGE FOR IV PUSH (FOR BLOOD PRESSURE SUPPORT)
PREFILLED_SYRINGE | INTRAVENOUS | Status: AC
  Filled 2023-07-01: qty 10

## 2023-07-01 MED ORDER — ONDANSETRON HCL 4 MG/2ML IJ SOLN
INTRAMUSCULAR | Status: AC
  Filled 2023-07-01: qty 2

## 2023-07-01 MED ORDER — SUGAMMADEX SODIUM 200 MG/2ML IV SOLN
INTRAVENOUS | Status: DC | PRN
  Administered 2023-07-01: 250 mg via INTRAVENOUS

## 2023-07-01 MED ORDER — DIPHENHYDRAMINE HCL 50 MG/ML IJ SOLN: 12.5000 mg | Freq: Four times a day (QID) | INTRAMUSCULAR | Status: DC | PRN

## 2023-07-01 MED ORDER — SODIUM CHLORIDE 0.9 % IV SOLN: 2.0000 g | INTRAVENOUS | Status: DC

## 2023-07-01 MED ORDER — MELATONIN 3 MG PO TABS
3.0000 mg | ORAL_TABLET | Freq: Every evening | ORAL | Status: DC | PRN
  Administered 2023-07-01: 3 mg via ORAL
  Filled 2023-07-01: qty 1

## 2023-07-01 MED ORDER — FENTANYL CITRATE PF 50 MCG/ML IJ SOSY
25.0000 ug | PREFILLED_SYRINGE | INTRAMUSCULAR | Status: DC | PRN
  Administered 2023-07-01 (×2): 50 ug via INTRAVENOUS

## 2023-07-01 MED ORDER — DIPHENHYDRAMINE HCL 12.5 MG/5ML PO ELIX: 12.5000 mg | ORAL_SOLUTION | Freq: Four times a day (QID) | ORAL | Status: DC | PRN

## 2023-07-01 MED ORDER — ONDANSETRON 4 MG PO TBDP: 4.0000 mg | ORAL_TABLET | Freq: Four times a day (QID) | ORAL | Status: DC | PRN

## 2023-07-01 MED ORDER — ACETAMINOPHEN 10 MG/ML IV SOLN
INTRAVENOUS | Status: AC
  Filled 2023-07-01: qty 100

## 2023-07-01 MED ORDER — METOPROLOL TARTRATE 5 MG/5ML IV SOLN: 5.0000 mg | Freq: Four times a day (QID) | INTRAVENOUS | Status: DC | PRN

## 2023-07-01 MED ORDER — MIDAZOLAM HCL 2 MG/2ML IJ SOLN
INTRAMUSCULAR | Status: DC | PRN
  Administered 2023-07-01: 2 mg via INTRAVENOUS

## 2023-07-01 MED ORDER — ONDANSETRON HCL 4 MG/2ML IJ SOLN
INTRAMUSCULAR | Status: DC | PRN
  Administered 2023-07-01: 4 mg via INTRAVENOUS

## 2023-07-01 MED ORDER — FENTANYL CITRATE (PF) 100 MCG/2ML IJ SOLN
INTRAMUSCULAR | Status: AC
  Filled 2023-07-01: qty 2

## 2023-07-01 MED ORDER — MIDAZOLAM HCL 2 MG/2ML IJ SOLN
INTRAMUSCULAR | Status: AC
  Filled 2023-07-01: qty 2

## 2023-07-01 MED ORDER — ENOXAPARIN SODIUM 40 MG/0.4ML IJ SOSY
40.0000 mg | PREFILLED_SYRINGE | INTRAMUSCULAR | Status: DC
  Administered 2023-07-01: 40 mg via SUBCUTANEOUS
  Filled 2023-07-01: qty 0.4

## 2023-07-01 MED ORDER — METHOCARBAMOL 500 MG PO TABS: 500.0000 mg | ORAL_TABLET | Freq: Three times a day (TID) | ORAL | 0 refills | Status: AC | PRN

## 2023-07-01 MED ORDER — PROCHLORPERAZINE EDISYLATE 10 MG/2ML IJ SOLN: 5.0000 mg | Freq: Four times a day (QID) | INTRAMUSCULAR | Status: DC | PRN

## 2023-07-01 MED ORDER — ACETAMINOPHEN 500 MG PO TABS
1000.0000 mg | ORAL_TABLET | Freq: Four times a day (QID) | ORAL | Status: DC
Start: 2023-07-01 — End: 2023-07-01
  Administered 2023-07-01 (×2): 1000 mg via ORAL
  Filled 2023-07-01 (×2): qty 2

## 2023-07-01 MED ORDER — OXYCODONE HCL 5 MG/5ML PO SOLN: 5.0000 mg | Freq: Once | ORAL | Status: AC | PRN

## 2023-07-01 MED ORDER — PROCHLORPERAZINE MALEATE 10 MG PO TABS: 10.0000 mg | ORAL_TABLET | Freq: Four times a day (QID) | ORAL | Status: DC | PRN

## 2023-07-01 MED ORDER — FENTANYL CITRATE PF 50 MCG/ML IJ SOSY
PREFILLED_SYRINGE | INTRAMUSCULAR | Status: AC
  Filled 2023-07-01: qty 2

## 2023-07-01 SURGICAL SUPPLY — 35 items
APPLIER CLIP ROT 10 11.4 M/L (STAPLE) IMPLANT
BAG COUNTER SPONGE SURGICOUNT (BAG) IMPLANT
CABLE HIGH FREQUENCY MONO STRZ (ELECTRODE) ×1 IMPLANT
CLIP APPLIE ROT 10 11.4 M/L (STAPLE) IMPLANT
COVER SURGICAL LIGHT HANDLE (MISCELLANEOUS) ×1 IMPLANT
CUTTER FLEX LINEAR 45M (STAPLE) IMPLANT
DERMABOND ADVANCED .7 DNX12 (GAUZE/BANDAGES/DRESSINGS) ×1 IMPLANT
DRAPE LAPAROSCOPIC ABDOMINAL (DRAPES) ×1 IMPLANT
ELECT REM PT RETURN 15FT ADLT (MISCELLANEOUS) ×1 IMPLANT
ENDOLOOP SUT PDS II 0 18 (SUTURE) IMPLANT
GLOVE BIO SURGEON STRL SZ 6 (GLOVE) ×1 IMPLANT
GLOVE INDICATOR 6.5 STRL GRN (GLOVE) ×1 IMPLANT
GOWN STRL REUS W/ TWL XL LVL3 (GOWN DISPOSABLE) ×1 IMPLANT
IRRIG SUCT STRYKERFLOW 2 WTIP (MISCELLANEOUS) ×1 IMPLANT
IRRIGATION SUCT STRKRFLW 2 WTP (MISCELLANEOUS) ×1 IMPLANT
KIT BASIN OR (CUSTOM PROCEDURE TRAY) ×1 IMPLANT
KIT TURNOVER KIT A (KITS) IMPLANT
PENCIL SMOKE EVACUATOR (MISCELLANEOUS) IMPLANT
RELOAD 45 VASCULAR/THIN (ENDOMECHANICALS) IMPLANT
RELOAD STAPLE 45 2.5 WHT GRN (ENDOMECHANICALS) IMPLANT
RELOAD STAPLE 45 3.5 BLU ETS (ENDOMECHANICALS) IMPLANT
RELOAD STAPLE TA45 3.5 REG BLU (ENDOMECHANICALS) ×1 IMPLANT
SCISSORS LAP 5X35 DISP (ENDOMECHANICALS) IMPLANT
SET TUBE SMOKE EVAC HIGH FLOW (TUBING) ×1 IMPLANT
SHEARS HARMONIC 36 ACE (MISCELLANEOUS) ×1 IMPLANT
SLEEVE Z-THREAD 5X100MM (TROCAR) ×1 IMPLANT
SPIKE FLUID TRANSFER (MISCELLANEOUS) ×1 IMPLANT
SUT MNCRL AB 4-0 PS2 18 (SUTURE) ×1 IMPLANT
SYS BAG RETRIEVAL 10MM (BASKET) ×1 IMPLANT
SYSTEM BAG RETRIEVAL 10MM (BASKET) ×1 IMPLANT
TOWEL OR 17X26 10 PK STRL BLUE (TOWEL DISPOSABLE) ×1 IMPLANT
TRAY FOLEY MTR SLVR 16FR STAT (SET/KITS/TRAYS/PACK) IMPLANT
TRAY LAPAROSCOPIC (CUSTOM PROCEDURE TRAY) ×1 IMPLANT
TROCAR BALLN 12MMX100 BLUNT (TROCAR) ×1 IMPLANT
TROCAR Z-THREAD OPTICAL 5X100M (TROCAR) ×1 IMPLANT

## 2023-07-01 NOTE — Op Note (Signed)
 Laparoscopic appendectomy  Indications: The patient presented with a history of right-sided abdominal pain. A CT revealed findings consistent with acute appendicitis.  Pre-operative Diagnosis: Acute appendicitis  Post-operative Diagnosis: Same, uncomplicated.  Surgeon: Almond Lint   Anesthesia: General endotracheal anesthesia  ASA Class: 3  Procedure Details  The patient was seen again in the Holding Room. The risks, benefits, complications, treatment options, and expected outcomes were discussed with the patient and/or family. The possibilities of perforation of viscus, bleeding, recurrent infection, the need for additional procedures, failure to diagnose a condition, and creating a complication requiring transfusion or operation were discussed. There was concurrence with the proposed plan and informed consent was obtained. The site of surgery was properly noted. The patient was taken to Operating Room, identified as Dan Jackson and the procedure verified as Appendectomy. A Time Out was held and the above information confirmed.  The patient was placed in the supine position and general anesthesia was induced, along with placement of orogastric tube, Venodyne boots, and a Foley catheter. The abdomen was prepped and draped in a sterile fashion.  The patient was placed into reverse Trendelenburg position and rotated to the right.  Local anesthetic was infiltrated at the left costal margin and an 8 mm incision was made.  A 5 mm Optiview trocar was used to access the abdomen under direct visualization.  The pneumoperitoneum was then established to steady pressure of 15 mmHg.   No evidence of injury was seen to the structures below the incision.  Additional 5 mm cannulas then placed in the left lower quadrant of the abdomen and the suprapubic region under direct visualization.  The left subcostal incision was upsized to a 12 mm port.  A careful evaluation of the entire abdomen was carried out.   There was only 1 band of omental adhesions to the umbilicus.  This was divided with the harmonic scalpel.  The patient was placed in Trendelenburg and rotated to the left.  The small intestines were retracted in the cephalad and left lateral direction away from the pelvis and right lower quadrant. The patient was found to have an enlarged and inflamed appendix in the right paracolic gutter position. There was no evidence of perforation.  The appendix was carefully dissected. The appendix was was skeletonized with the harmonic scalpel.   The appendix was divided at its base using an endo-GIA stapler. No appreciable appendiceal stump was left in place. The appendix was removed from the abdomen with a retrieval bag through the left subcostal port.  Some additional fascia in the left subcostal incision had to be divided in order to get the appendix out.  There was a small amount of bleeding from the muscle at the left subcostal port as evidenced by the laparoscopic camera.  There was no evidence of bleeding, leakage, or complication after division of the appendix.  After reevaluating the abdomen, the left subcostal incision was reexamined internally with the camera.  The trocar was removed.  At this point the previously oozing muscle had stopped bleeding.  The 5 mm trocars were removed.  The pneumoperitoneum was evacuated from the abdomen.  A 0 Vicryl was used to close the fascia at the left subcostal port.  One figure-of-eight stitch was placed and a second simple interrupted stitch was placed there was no appreciable residual fascial defect.  The trocar site skin wounds were closed with 4-0 Monocryl and dressed with Dermabond.  Instrument, sponge, and needle counts were correct at the conclusion of the  case.   Findings: The appendix was found to be inflamed.  The tip of the appendix was folded over a bit onto itself.  There were not signs of necrosis.  There was not perforation. There was not abscess  formation.  Estimated Blood Loss:  Minimal         Drains: n/a          Specimens: appendix to pathology         Complications:  None; patient tolerated the procedure well.         Disposition: PACU - hemodynamically stable.         Condition: stable

## 2023-07-01 NOTE — Anesthesia Postprocedure Evaluation (Signed)
 Anesthesia Post Note  Patient: Dan Jackson  Procedure(s) Performed: APPENDECTOMY LAPAROSCOPIC (Abdomen)     Patient location during evaluation: PACU Anesthesia Type: General Level of consciousness: awake and alert Pain management: pain level controlled Vital Signs Assessment: post-procedure vital signs reviewed and stable Respiratory status: spontaneous breathing, nonlabored ventilation, respiratory function stable and patient connected to nasal cannula oxygen Cardiovascular status: blood pressure returned to baseline and stable Postop Assessment: no apparent nausea or vomiting Anesthetic complications: no   No notable events documented.  Last Vitals:  Vitals:   07/01/23 1210 07/01/23 1315  BP: 117/71 (!) 143/72  Pulse: 73 80  Resp: 17 18  Temp: 36.6 C 36.7 C  SpO2: 95% 97%    Last Pain:  Vitals:   07/01/23 1124  TempSrc: Oral  PainSc:                  [place]

## 2023-07-01 NOTE — Progress Notes (Signed)
 Assessment unchanged. Pt met goals for dc home as outlined by Dr. Donell Beers. Pt and wife verbalized understanding of dc instructions including medications to resume as well as activity, follow up care and when to call the MD. Discharged via wc to front entrance by NT.

## 2023-07-01 NOTE — Anesthesia Preprocedure Evaluation (Signed)
 Anesthesia Evaluation  Patient identified by MRN, date of birth, ID band Patient awake    Reviewed: Allergy & Precautions, H&P , NPO status , Patient's Chart, lab work & pertinent test results  Airway Mallampati: II  TM Distance: >3 FB Neck ROM: Full    Dental no notable dental hx.    Pulmonary sleep apnea , former smoker   Pulmonary exam normal breath sounds clear to auscultation       Cardiovascular hypertension, negative cardio ROS Normal cardiovascular exam Rhythm:Regular Rate:Normal     Neuro/Psych Neurofibromatosis type II  negative psych ROS   GI/Hepatic Neg liver ROS,GERD  ,,acute appendicitis   Endo/Other  negative endocrine ROS    Renal/GU negative Renal ROS  negative genitourinary   Musculoskeletal negative musculoskeletal ROS (+)    Abdominal   Peds negative pediatric ROS (+)  Hematology negative hematology ROS (+)   Anesthesia Other Findings   Reproductive/Obstetrics negative OB ROS                             Anesthesia Physical Anesthesia Plan  ASA: 3  Anesthesia Plan: General   Post-op Pain Management: Tylenol PO (pre-op)*   Induction: Intravenous  PONV Risk Score and Plan: 2 and Ondansetron and Dexamethasone  Airway Management Planned: Oral ETT  Additional Equipment:   Intra-op Plan:   Post-operative Plan: Extubation in OR  Informed Consent: I have reviewed the patients History and Physical, chart, labs and discussed the procedure including the risks, benefits and alternatives for the proposed anesthesia with the patient or authorized representative who has indicated his/her understanding and acceptance.     Dental advisory given  Plan Discussed with: CRNA  Anesthesia Plan Comments:        Anesthesia Quick Evaluation

## 2023-07-01 NOTE — Plan of Care (Signed)
 ?  Problem: Clinical Measurements: ?Goal: Will remain free from infection ?Outcome: Progressing ?  ?

## 2023-07-01 NOTE — Anesthesia Procedure Notes (Signed)
 Procedure Name: Intubation Date/Time: 07/01/2023 7:49 AM  Performed by: Nelle Don, CRNAPre-anesthesia Checklist: Patient identified, Emergency Drugs available, Suction available and Patient being monitored Patient Re-evaluated:Patient Re-evaluated prior to induction Oxygen Delivery Method: Circle system utilized Preoxygenation: Pre-oxygenation with 100% oxygen Induction Type: IV induction Ventilation: Mask ventilation without difficulty and Oral airway inserted - appropriate to patient size Laryngoscope Size: Mac and 4 Grade View: Grade II Tube type: Oral Tube size: 7.5 mm Number of attempts: 1 Airway Equipment and Method: Stylet Placement Confirmation: ETT inserted through vocal cords under direct vision, positive ETCO2 and breath sounds checked- equal and bilateral Secured at: 24 cm Tube secured with: Tape Dental Injury: Teeth and Oropharynx as per pre-operative assessment

## 2023-07-01 NOTE — Discharge Instructions (Signed)
 [facility] Office Phone Number [phone]   POST OP INSTRUCTIONS  Always review your discharge instruction sheet given to you by the facility where your surgery was performed.  IF YOU HAVE DISABILITY OR FAMILY LEAVE FORMS, YOU MUST BRING THEM TO THE OFFICE FOR PROCESSING.  DO NOT GIVE THEM TO YOUR DOCTOR.  Take 2 tylenol (acetominophen) three times a day for 3 days.  If you still have pain, add ibuprofen with food in between if able to take this (if you have kidney issues or stomach issues, do not take ibuprofen).  If both of those are not enough, add the narcotic pain pill.  If you find you are needing a lot of this overnight after surgery, call the next morning for a refill.   Take your usually prescribed medications unless otherwise directed If you need a refill on your pain medication, please contact your pharmacy.  They will contact our office to request authorization.  Prescriptions will not be filled after 5pm or on week-ends. You should eat very light the first 24 hours after surgery, such as soup, crackers, pudding, etc.  Resume your normal diet the day after surgery It is common to experience some constipation if taking pain medication after surgery.  Increasing fluid intake and taking a stool softener will usually help or prevent this problem from occurring.  A mild laxative (Milk of Magnesia or Miralax) should be taken according to package directions if there are no bowel movements after 48 hours. You may shower in 48 hours.  The surgical glue will flake off in 2-3 weeks.   ACTIVITIES:  No strenuous activity or heavy lifting for 2 week.   You may drive when you no longer are taking prescription pain medication, you can comfortably wear a seatbelt, and you can safely maneuver your car and apply brakes. RETURN TO WORK:  __________2 weeks_______________ Dennis Bast should see your doctor in the office for a follow-up appointment approximately three-four weeks after your surgery.     WHEN TO CALL YOUR DOCTOR: Fever over 101.0 Nausea and/or vomiting. Extreme swelling or bruising. Continued bleeding from incision. Increased pain, redness, or drainage from the incision.  The clinic staff is available to answer your questions during regular business hours.  Please don't hesitate to call and ask to speak to one of the nurses for clinical concerns.  If you have a medical emergency, go to the nearest emergency room or call 911.  A surgeon from [facility] is always on call at the hospital.  For further questions, please visit [email or web address]

## 2023-07-01 NOTE — Plan of Care (Signed)

## 2023-07-01 NOTE — Transfer of Care (Signed)
 Immediate Anesthesia Transfer of Care Note  Patient: Dan Jackson  Procedure(s) Performed: APPENDECTOMY LAPAROSCOPIC  Patient Location: PACU  Anesthesia Type:General  Level of Consciousness: awake, alert , and oriented  Airway & Oxygen Therapy: Patient Spontanous Breathing and Patient connected to face mask oxygen  Post-op Assessment: Report given to RN, Post -op Vital signs reviewed and stable, and Patient moving all extremities X 4  Post vital signs: Reviewed and stable  Last Vitals:  Vitals Value Taken Time  BP 129/80 07/01/23 0900  Temp    Pulse 89 07/01/23 0900  Resp 18 07/01/23 0900  SpO2 100 % 07/01/23 0900  Vitals shown include unfiled device data.  Last Pain:  Vitals:   07/01/23 0611  TempSrc: Oral  PainSc:          Complications: No notable events documented.

## 2023-07-01 NOTE — TOC Initial Note (Signed)
 Transition of Care [facility]) - Initial/Assessment Note    Patient Details  Name: Dan Jackson MRN: [id number] Date of Birth: 21-Sep-1975  Transition of Care [facility]) CM/SW Contact:    Adrian Prows, RN Phone Number: 07/01/2023, 12:51 PM  Clinical Narrative:                 Sherron Monday w/ pt and SO Charlott Rakes [phone]) in room; pt says he lives at home; he plans to return at d/c; his SO will provide transport; pt verified insurance/PCP; he denies SDOH risks; pt says he does not have DME, HH services, or home oxygen; no TOC needs.  Expected Discharge Plan: Home/Self Care Barriers to Discharge: No Barriers Identified   Patient Goals and CMS Choice Patient states their goals for this hospitalization and ongoing recovery are:: home          Expected Discharge Plan and Services   Discharge Planning Services: CM Consult Post Acute Care Choice: NA Living arrangements for the past 2 months: Single Family Home Expected Discharge Date: 07/01/23               DME Arranged: N/A DME Agency: NA       HH Arranged: NA HH Agency: NA        Prior Living Arrangements/Services Living arrangements for the past 2 months: Single Family Home Lives with:: Self Patient language and need for interpreter reviewed:: Yes Do you feel safe going back to the place where you live?: Yes      Need for Family Participation in Patient Care: Yes (Comment) Care giver support system in place?: Yes (comment) Current home services:  (n/a) Criminal Activity/Legal Involvement Pertinent to Current Situation/Hospitalization: No - Comment as needed  Activities of Daily Living   ADL Screening (condition at time of admission) Independently performs ADLs?: Yes (appropriate for developmental age) Is the patient deaf or have difficulty hearing?: Yes Does the patient have difficulty seeing, even when wearing glasses/contacts?: No Does the patient have difficulty concentrating, remembering, or making  decisions?: No  Permission Sought/Granted Permission sought to share information with : Case Manager Permission granted to share information with : Yes, Verbal Permission Granted  Share Information with NAME: Case Manger     Permission granted to share info w Relationship: Charlott Rakes (SO) [phone]     Emotional Assessment Appearance:: Appears stated age Attitude/Demeanor/Rapport: Gracious Affect (typically observed): Accepting Orientation: : Oriented to Self, Oriented to Place, Oriented to  Time, Oriented to Situation Alcohol / Substance Use: Not Applicable Psych Involvement: No (comment)  Admission diagnosis:  Acute appendicitis [K35.80] Acute appendicitis, unspecified acute appendicitis type [K35.80] Patient Active Problem List   Diagnosis Date Noted   Acute appendicitis 06/30/2023   PCP:  Tally Joe, MD Pharmacy:   [id number] - [address] ([address]), [place] - [address] [place] ([place]) [place] [address] Phone: [phone] Fax: [phone]  [id number] - [place], [place] - [address] [place] [address] Phone: [phone] Fax: [phone]     Social Drivers of Health (SDOH) Social History: SDOH Screenings   Food Insecurity: No Food Insecurity (07/01/2023)  Housing: Low Risk  (07/01/2023)  Transportation Needs: No Transportation Needs (07/01/2023)  Utilities: Not At Risk (07/01/2023)  Tobacco Use: Medium Risk (06/30/2023)   SDOH Interventions: Food Insecurity Interventions: Intervention Not Indicated, Inpatient TOC Housing Interventions: Intervention Not Indicated, Inpatient TOC Transportation Interventions: Intervention Not Indicated, Inpatient TOC Utilities Interventions: Intervention Not Indicated, Inpatient TOC   Readmission  Risk Interventions     No data to display

## 2023-07-02 ENCOUNTER — Encounter (HOSPITAL_COMMUNITY): Payer: Self-pay | Admitting: General Surgery

## 2023-07-02 NOTE — Discharge Summary (Signed)
 Physician Discharge Summary  Patient ID: Dan Jackson MRN: [id number] DOB/AGE: 48/29/1977 48 y.o.  Admit date: 06/30/2023 Discharge date: 07/02/2023  Admission Diagnoses: Acute appendicitis  Discharge Diagnoses:  Principal Problem:   Acute appendicitis   Discharged Condition: stable  Hospital Course:  Patient was transferred to [facility] with acute appendicitis.  Pt had leukocytosis and CT c/w acute appy.  Pt underwent lap appy 2/23 and did well post op.  There was no evidence for perforation or abscess.  Pt discharged to home day of surgery.    Consults: None  Significant Diagnostic Studies: labs: WBCs 15k  Treatments: surgery: see above  Discharge Exam: Blood pressure (!) 143/72, pulse 80, temperature 98 F (36.7 C), resp. rate 18, height 6\' 2"  (1.88 m), weight 120.1 kg, SpO2 97%. General appearance: alert, cooperative, and no distress Resp: breathing comfortably GI: soft, non distended, approp tender  Disposition: Discharge disposition: 01-Home or Self Care       Discharge Instructions     Call MD for:  difficulty breathing, headache or visual disturbances   Complete by: As directed    Call MD for:  hives   Complete by: As directed    Call MD for:  persistant nausea and vomiting   Complete by: As directed    Call MD for:  redness, tenderness, or signs of infection (pain, swelling, redness, odor or green/yellow discharge around incision site)   Complete by: As directed    Call MD for:  severe uncontrolled pain   Complete by: As directed    Call MD for:  temperature >100.4   Complete by: As directed    Diet - low sodium heart healthy   Complete by: As directed    Increase activity slowly   Complete by: As directed       Allergies as of 07/01/2023   No Known Allergies      Medication List     TAKE these medications    amLODipine 5 MG tablet Commonly known as: NORVASC Take 5 mg by mouth daily.   FIBER PO Take 1  tablet by mouth daily.   hydrochlorothiazide 25 MG tablet Commonly known as: HYDRODIURIL Take 25 mg by mouth daily.   ibuprofen 800 MG tablet Commonly known as: ADVIL Take 1 tablet (800 mg total) by mouth every 8 (eight) hours as needed.   Janumet XR [phone] MG Tb24 Generic drug: SitaGLIPtin-MetFORMIN HCl Take 1 tablet by mouth every evening.   methocarbamol 500 MG tablet Commonly known as: ROBAXIN Take 1 tablet (500 mg total) by mouth every 8 (eight) hours as needed for muscle spasms.   MULTIVITAMIN PO Take 1 tablet by mouth daily.   olmesartan 40 MG tablet Commonly known as: BENICAR Take 40 mg by mouth daily.   OMEGA 3 PO Take 3 capsules by mouth daily.   omeprazole 20 MG capsule Commonly known as: PRILOSEC Take 20 mg by mouth daily.   ondansetron 4 MG disintegrating tablet Commonly known as: ZOFRAN-ODT Take 1 tablet (4 mg total) by mouth every 6 (six) hours as needed for nausea.   oxyCODONE 5 MG immediate release tablet Commonly known as: Oxy IR/ROXICODONE Take 1 tablet (5 mg total) by mouth every 6 (six) hours as needed for severe pain (pain score 7-10).   rosuvastatin 10 MG tablet Commonly known as: CRESTOR Take 10 mg by mouth daily.        Follow-up Information     [facility] Follow up  in 3 week(s).   Specialty: General Surgery Why: I have sent a message so they may call you first. Contact information: [address] [place] [address] [phone]                 Signed: Almond Lint 07/02/2023, 3:09 PM

## 2023-07-03 LAB — SURGICAL PATHOLOGY
